# Patient Record
Sex: Female | Born: 1990 | Race: Black or African American | Hispanic: No | Marital: Single | Smoking: Never smoker
Health system: Southern US, Community
[De-identification: ages and names within clinical notes are randomized; demographics above are authoritative.]

## PROBLEM LIST (undated history)

## (undated) ENCOUNTER — Inpatient Hospital Stay (HOSPITAL_COMMUNITY): Payer: Self-pay

## (undated) DIAGNOSIS — Z789 Other specified health status: Secondary | ICD-10-CM

## (undated) DIAGNOSIS — D649 Anemia, unspecified: Secondary | ICD-10-CM

## (undated) HISTORY — PX: OTHER SURGICAL HISTORY: SHX169

## (undated) HISTORY — DX: Other specified health status: Z78.9

---
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
## 2008-07-16 IMAGING — US US GENETIC COUNSELING - NRPT [facility]
1 series · 14 of 16 positions shown · non-contrast
Comparison: none

OBSTETRICAL ULTRASOUND:
 This ultrasound was performed in The [HOSPITAL], and the [facility] report will be stored to [REDACTED] PACS.

[Series 1: us genetic counseling - nrpt [facility] · 14 of 17 slices shown]
[im 1/17]
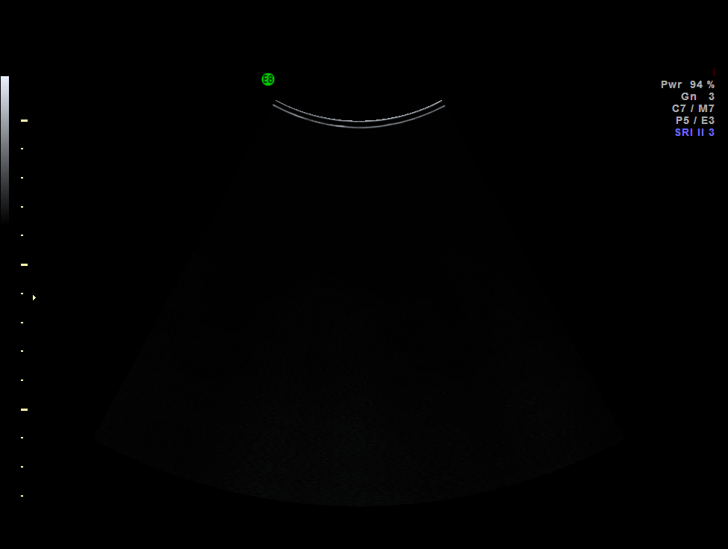
[im 2/17]
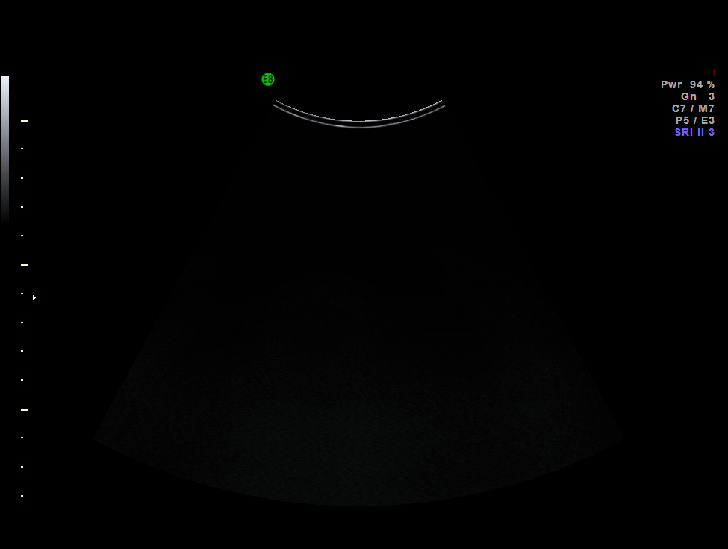
[im 3/17]
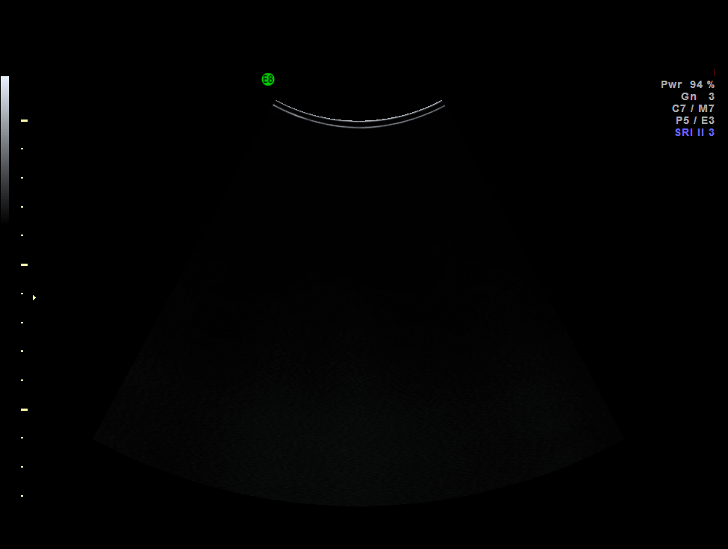
[im 5/17]
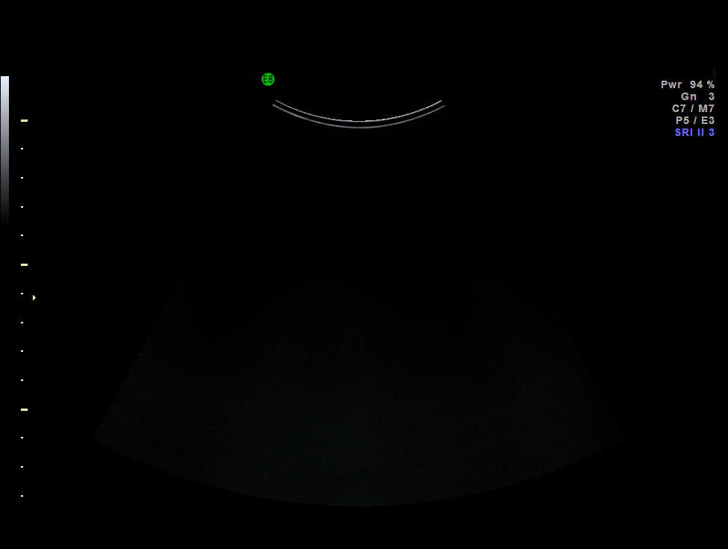
[im 6/17]
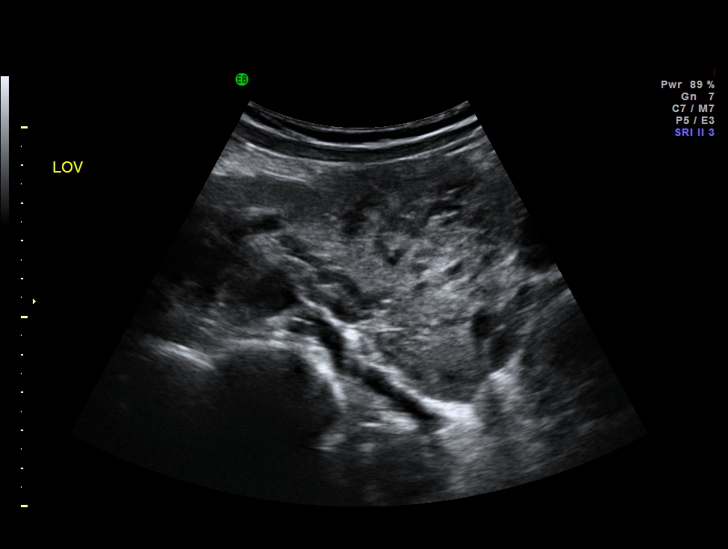
[im 7/17]
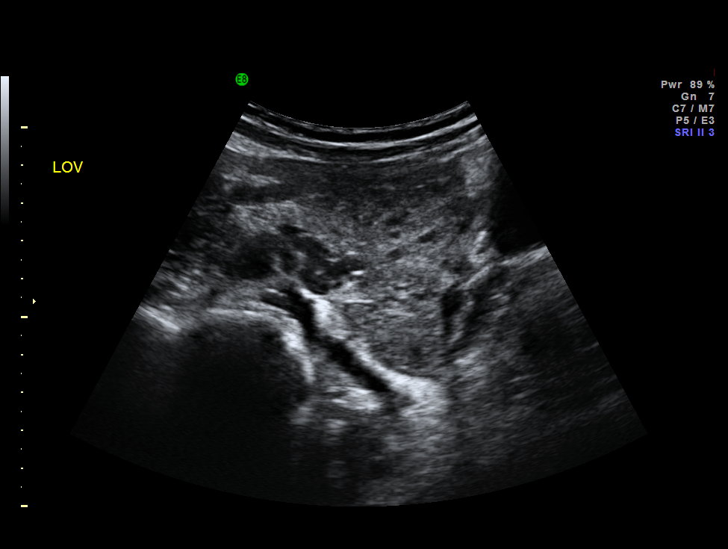
[im 8/17]
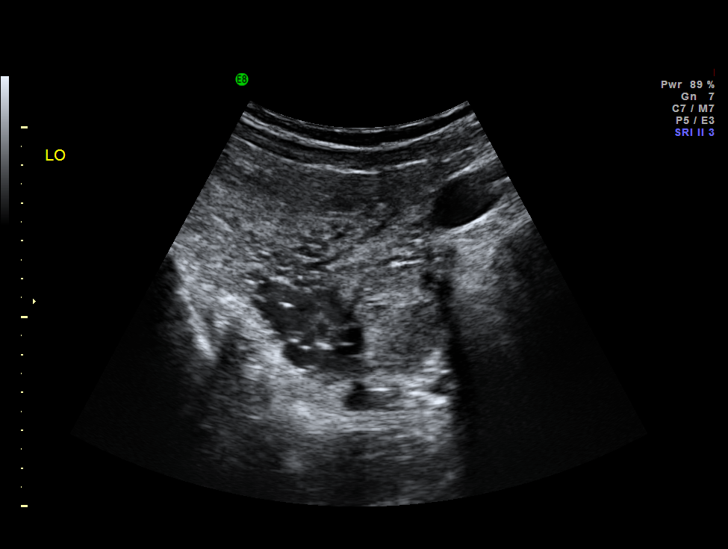
[im 9/17]
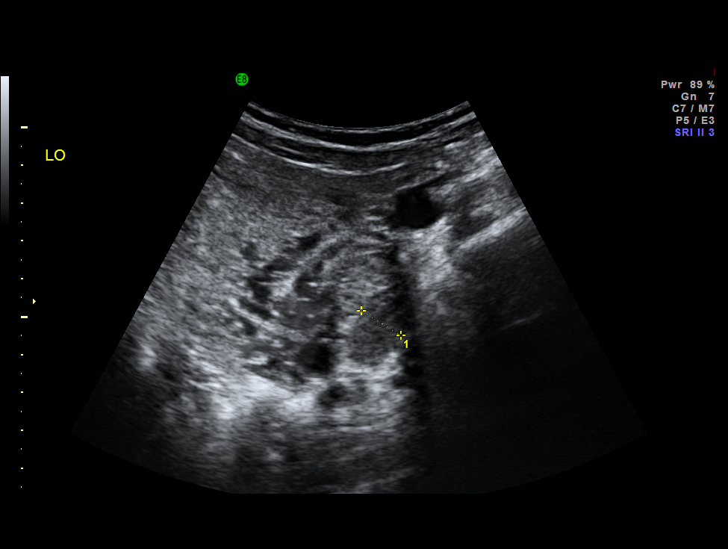
[im 10/17]
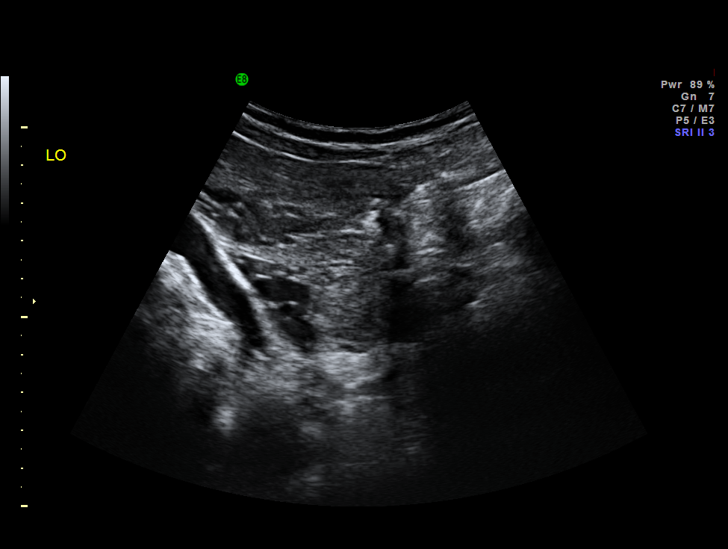
[im 11/17]
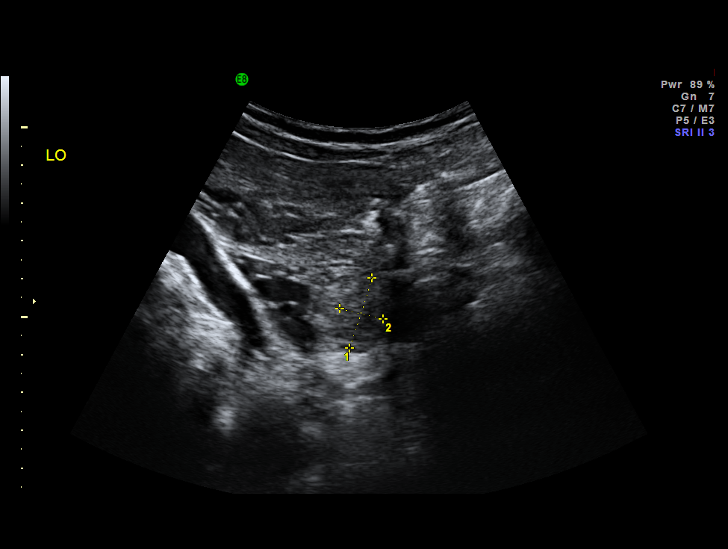
[im 13/17]
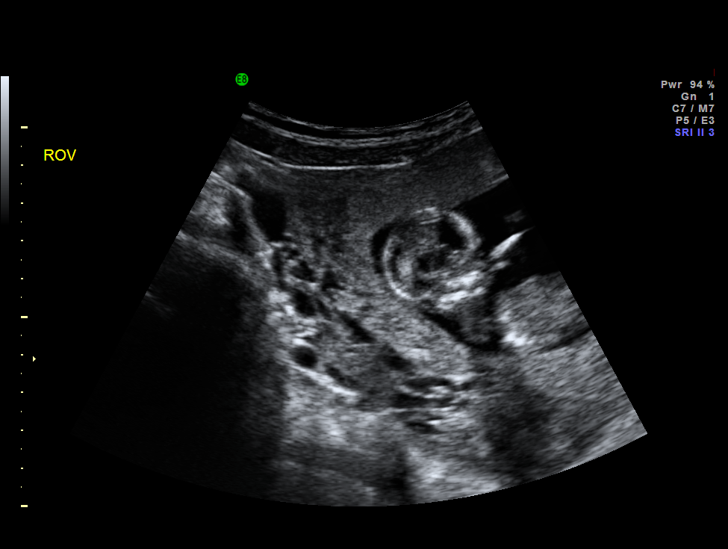
[im 14/17]
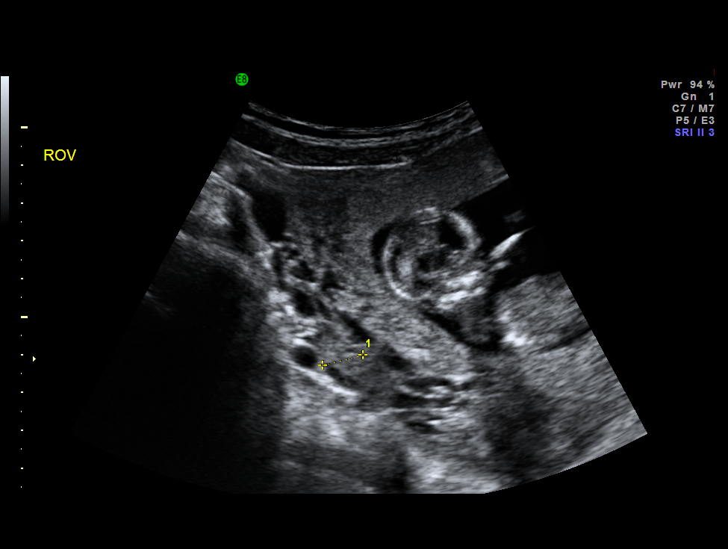
[im 15/17]
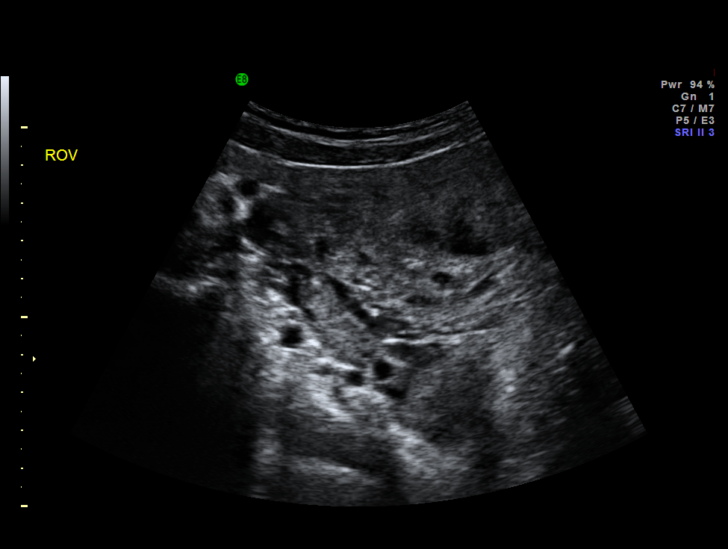
[im 17/17]
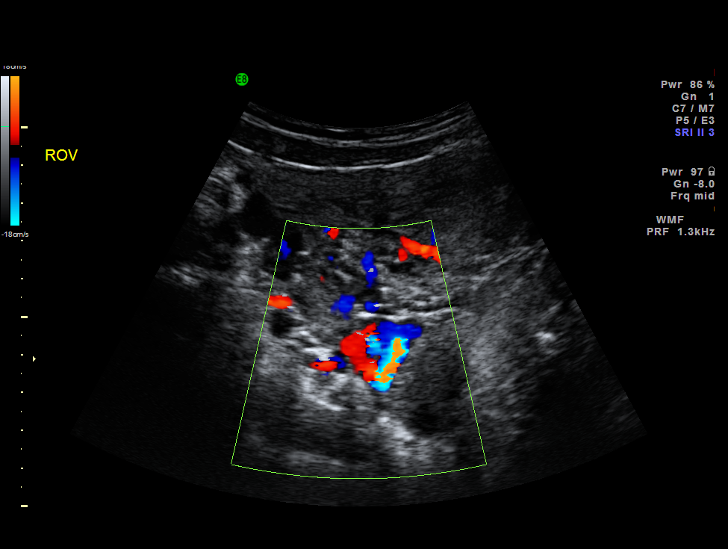

[14 of 16 positions shown; findings below may reference images not displayed]

IMPRESSION: The [facility] report has also been faxed to the ordering physician.

## 2010-12-30 ENCOUNTER — Encounter: Payer: Self-pay | Admitting: Obstetrics and Gynecology

## 2020-08-30 ENCOUNTER — Emergency Department (HOSPITAL_COMMUNITY)
Admission: EM | Admit: 2020-08-30 | Discharge: 2020-08-30 | Disposition: A | Discharge status: Home / Self Care | Payer: Medicaid Other | Attending: Emergency Medicine | Admitting: Emergency Medicine

## 2020-08-30 ENCOUNTER — Encounter (HOSPITAL_COMMUNITY): Payer: Self-pay

## 2020-08-30 ENCOUNTER — Other Ambulatory Visit: Payer: Self-pay

## 2020-08-30 DIAGNOSIS — M79602 Pain in left arm: Secondary | ICD-10-CM | POA: Diagnosis not present

## 2020-08-30 NOTE — Discharge Instructions (Addendum)
You will need to follow up with the health department or with the OB-GYN that was provided on your discharge paperwork.   Please return to the emergency department for any new or worsening symptoms.

## 2020-08-30 NOTE — ED Provider Notes (Signed)
[facility] EMERGENCY DEPARTMENT Provider Note   CSN: [id number] Arrival date & time: 08/30/20  1607     History Chief Complaint  Patient presents with  . Arm Pain    Gabrielle Gibson is a 29 y.o. female.  HPI   29 year old female presenting emergency department today for evaluation of arm pain.  Patient states that she had a Nexplanon placed about 6 weeks ago and since it was placed it has been causing her pain and she wants it taken out. Had some swelling initially but it has since improved.  There is no redness or fevers.  She has had no drainage of pus.  She tried to follow-up with health department and they said it would take 6 months before they can get it out  History reviewed. No pertinent past medical history.  There are no problems to display for this patient.   History reviewed. No pertinent surgical history.   OB History   No obstetric history on file.     No family history on file.  Social History   Tobacco Use  . Smoking status: Not on file  Substance Use Topics  . Alcohol use: Not on file  . Drug use: Not on file    Home Medications Prior to Admission medications   Not on File    Allergies    Patient has no allergy information on record.  Review of Systems   Review of Systems  Constitutional: Negative for fever.  Musculoskeletal:       Left arm pain  Skin: Negative for color change and wound.    Physical Exam Updated Vital Signs BP (!) 150/104 (BP Location: Right Arm)   Pulse (!) 107   Temp 98.1 F (36.7 C) (Oral)   Resp 18   SpO2 98%   Physical Exam Vitals and nursing note reviewed.  Constitutional:      General: She is not in acute distress.    Appearance: She is well-developed.  HENT:     Head: Normocephalic and atraumatic.  Eyes:     Conjunctiva/sclera: Conjunctivae normal.  Cardiovascular:     Rate and Rhythm: Normal rate.  Pulmonary:     Effort: Pulmonary effort is normal.  Musculoskeletal:         General: Normal range of motion.     Cervical back: Neck supple.     Comments: Mild TTP to the left upper arm over the pts nexplanon. There is no erythema, induration, fluctuance or warmth to the area.   Skin:    General: Skin is warm and dry.  Neurological:     Mental Status: She is alert.     ED Results / Procedures / Treatments   Labs (all labs ordered are listed, but only abnormal results are displayed) Labs Reviewed - No data to display  EKG None  Radiology No results found.  Procedures Procedures (including critical care time)  Medications Ordered in ED Medications - No data to display  ED Course  I have reviewed the triage vital signs and the nursing notes.  Pertinent labs & imaging results that were available during my care of the patient were reviewed by me and considered in my medical decision making (see chart for details).    MDM Rules/Calculators/A&P                          29 year old female presenting for evaluation requesting Nexplanon removal.  Had it placed several  weeks ago and has had pain ever since and is requesting to remove.  No fevers.  On exam she does not have any evidence of infection.  Discussed that this is not an emergent procedure needs to be completed by the person who placed the Nexplanon.  Patient was upset with this and asked to speak with my attending physician Dr. Rush Landmark who spoke with patient and reiterated plan.  5:23 PM CONSULT [place] with transitions of care. She states pt will need to f/u with health dept.   Discussed with [facility] provider, they do not take out nexplanon emergently  We will give patient information to follow-up with OB/GYN.  Advised to return if worse.  She voices understanding of plan reasons return precautions.  Patient able for discharge.  Final Clinical Impression(s) / ED Diagnoses Final diagnoses:  Pain in left arm    Rx / DC Orders ED Discharge Orders    None       Rayne Du 08/30/20 1756    Tegeler, Canary Brim, MD 08/30/20 1807

## 2020-08-30 NOTE — ED Triage Notes (Signed)
Pt reports severe pain in her left upper arm for the past 6 months were her nexplanon was implanted. Pt went to the health department to get it removed but they cant get her in for another 3 months.

## 2020-08-30 NOTE — ED Notes (Signed)
Went to D/C pt and pt was not in the hallway. Pt appeared in no apparent distress when I last saw her.

## 2020-09-04 ENCOUNTER — Other Ambulatory Visit: Payer: Self-pay

## 2020-09-04 ENCOUNTER — Ambulatory Visit (INDEPENDENT_AMBULATORY_CARE_PROVIDER_SITE_OTHER): Payer: Medicaid Other | Admitting: Obstetrics & Gynecology

## 2020-09-04 VITALS — BP 126/82 | HR 73 | Ht 63.0 in | Wt 171.6 lb

## 2020-09-04 DIAGNOSIS — Z3046 Encounter for surveillance of implantable subdermal contraceptive: Secondary | ICD-10-CM

## 2020-09-04 NOTE — Patient Instructions (Signed)
Etonogestrel implant What is this medicine? ETONOGESTREL (et oh noe JES trel) is a contraceptive (birth control) device. It is used to prevent pregnancy. It can be used for up to 3 years. This medicine may be used for other purposes; ask your health care provider or pharmacist if you have questions. COMMON BRAND NAME(S): Implanon, Nexplanon What should I tell my health care provider before I take this medicine? They need to know if you have any of these conditions:  abnormal vaginal bleeding  blood vessel disease or blood clots  breast, cervical, endometrial, ovarian, liver, or uterine cancer  diabetes  gallbladder disease  heart disease or recent heart attack  high blood pressure  high cholesterol or triglycerides  kidney disease  liver disease  migraine headaches  seizures  stroke  tobacco smoker  an unusual or allergic reaction to etonogestrel, anesthetics or antiseptics, other medicines, foods, dyes, or preservatives  pregnant or trying to get pregnant  breast-feeding How should I use this medicine? This device is inserted just under the skin on the inner side of your upper arm by a health care professional. Talk to your pediatrician regarding the use of this medicine in children. Special care may be needed. Overdosage: If you think you have taken too much of this medicine contact a poison control center or emergency room at once. NOTE: This medicine is only for you. Do not share this medicine with others. What if I miss a dose? This does not apply. What may interact with this medicine? Do not take this medicine with any of the following medications:  amprenavir  fosamprenavir This medicine may also interact with the following medications:  acitretin  aprepitant  armodafinil  bexarotene  bosentan  carbamazepine  certain medicines for fungal infections like fluconazole, ketoconazole, itraconazole and voriconazole  certain medicines to treat  hepatitis, HIV or AIDS  cyclosporine  felbamate  griseofulvin  lamotrigine  modafinil  oxcarbazepine  phenobarbital  phenytoin  primidone  rifabutin  rifampin  rifapentine  St. John's wort  topiramate This list may not describe all possible interactions. Give your health care provider a list of all the medicines, herbs, non-prescription drugs, or dietary supplements you use. Also tell them if you smoke, drink alcohol, or use illegal drugs. Some items may interact with your medicine. What should I watch for while using this medicine? This product does not protect you against HIV infection (AIDS) or other sexually transmitted diseases. You should be able to feel the implant by pressing your fingertips over the skin where it was inserted. Contact your doctor if you cannot feel the implant, and use a non-hormonal birth control method (such as condoms) until your doctor confirms that the implant is in place. Contact your doctor if you think that the implant may have broken or become bent while in your arm. You will receive a user card from your health care provider after the implant is inserted. The card is a record of the location of the implant in your upper arm and when it should be removed. Keep this card with your health records. What side effects may I notice from receiving this medicine? Side effects that you should report to your doctor or health care professional as soon as possible:  allergic reactions like skin rash, itching or hives, swelling of the face, lips, or tongue  breast lumps, breast tissue changes, or discharge  breathing problems  changes in emotions or moods  coughing up blood  if you feel that the implant   may have broken or bent while in your arm  high blood pressure  pain, irritation, swelling, or bruising at the insertion site  scar at site of insertion  signs of infection at the insertion site such as fever, and skin redness, pain or  discharge  signs and symptoms of a blood clot such as breathing problems; changes in vision; chest pain; severe, sudden headache; pain, swelling, warmth in the leg; trouble speaking; sudden numbness or weakness of the face, arm or leg  signs and symptoms of liver injury like dark yellow or brown urine; general ill feeling or flu-like symptoms; light-colored stools; loss of appetite; nausea; right upper belly pain; unusually weak or tired; yellowing of the eyes or skin  unusual vaginal bleeding, discharge Side effects that usually do not require medical attention (report to your doctor or health care professional if they continue or are bothersome):  acne  breast pain or tenderness  headache  irregular menstrual bleeding  nausea This list may not describe all possible side effects. Call your doctor for medical advice about side effects. You may report side effects to FDA at 1-800-FDA-1088. Where should I keep my medicine? This drug is given in a hospital or clinic and will not be stored at home. NOTE: This sheet is a summary. It may not cover all possible information. If you have questions about this medicine, talk to your doctor, pharmacist, or health care provider.  2020 Elsevier/Gold Standard (2019-09-07 11:33:04)  

## 2020-09-04 NOTE — Progress Notes (Signed)
GYNECOLOGY OFFICE PROCEDURE NOTE  Gabrielle Gibson is a 29 y.o. No obstetric history on file. here for Nexplanon removal.  Last pap smear was on at [facility] this year and was normal.  No other gynecologic concerns.    Nexplanon Removal Patient identified, informed consent performed, consent signed.   Appropriate time out taken. Nexplanon site identified.  Area prepped in usual sterile fashon. One ml of 1% lidocaine was used to anesthetize the area at the distal end of the implant. A small stab incision was made right beside the implant on the distal portion.  The Nexplanon rod was grasped using hemostats and removed without difficulty.  There was minimal blood loss. There were no complications.  Steri-strips were applied over the small incision.  A pressure bandage was applied to reduce any bruising.  The patient tolerated the procedure well and was given post procedure instructions.  Patient is planning to attempt conception.      Adam Phenix, MD Attending Obstetrician & Gynecologist, [facility]  09/04/2020

## 2020-09-28 ENCOUNTER — Encounter: Payer: Self-pay | Admitting: *Deleted

## 2022-07-08 ENCOUNTER — Ambulatory Visit (HOSPITAL_COMMUNITY)
Admission: EM | Admit: 2022-07-08 | Discharge: 2022-07-08 | Disposition: A | Discharge status: Home / Self Care | Payer: Medicaid Other | Attending: Family Medicine | Admitting: Family Medicine

## 2022-07-08 ENCOUNTER — Encounter (HOSPITAL_COMMUNITY): Payer: Self-pay | Admitting: Emergency Medicine

## 2022-07-08 DIAGNOSIS — S00452A Superficial foreign body of left ear, initial encounter: Secondary | ICD-10-CM

## 2022-07-08 DIAGNOSIS — T162XXA Foreign body in left ear, initial encounter: Secondary | ICD-10-CM

## 2022-07-08 MED ORDER — LIDOCAINE HCL (PF) 1 % IJ SOLN
INTRAMUSCULAR | Status: AC
  Filled 2022-07-08: qty 30

## 2022-07-08 NOTE — ED Triage Notes (Signed)
Pt reports getting an ear ring back stuck in her left ear. States it has been stuck for 1.5 days.

## 2022-07-08 NOTE — Discharge Instructions (Signed)
Follow instructions from your health care provider about how to take care of your wound. Make sure you: Wash your hands with soap and water for at least 20 seconds before and after you change your bandage (dressing). If soap and water are not available, use hand sanitizer. Change your dressing as told by your health care provider. Check your wound every day for signs of infection. Check for: Redness, swelling, or pain. Fluid or blood. Pus or a bad smell. New warmth, a rash, or hardness at the wound site.

## 2022-07-10 NOTE — ED Provider Notes (Signed)
[facility]   [id number] 07/08/22 Arrival Time: 1228  ASSESSMENT & PLAN:  1. Acute foreign body of left earlobe, initial encounter     Foreign Body Removal Procedure Note  Anesthesia: 1% plain lidocaine  Procedure Details  The procedure, risks and complications have been discussed in detail (including, but not limited to pain and bleeding) with the patient.  The skin induration was prepped and draped in the usual fashion. After lower ear block performed with lidocaine, an approx 3 mm incision made over lower posterior LEFT earlobe to expose metal earring backing; using Kelly hemostat, FB grasped and removed without complication. Minimal bleeding. No suturing required. No signs of infection.  Packing: n/a Condition: Tolerated procedure well Complications: none.     Discharge Instructions      Follow instructions from your health care provider about how to take care of your wound. Make sure you: Wash your hands with soap and water for at least 20 seconds before and after you change your bandage (dressing). If soap and water are not available, use hand sanitizer. Change your dressing as told by your health care provider. Check your wound every day for signs of infection. Check for: Redness, swelling, or pain. Fluid or blood. Pus or a bad smell. New warmth, a rash, or hardness at the wound site.    OTC analgesics as needed.  Reviewed expectations re: course of current medical issues. Questions answered. Outlined signs and symptoms indicating need for more acute intervention. Patient verbalized understanding. After Visit Summary given.   SUBJECTIVE:  Gabrielle Gibson is a 31 y.o. female who reports back of earring stuck in earlobe; unsure how long. Unable to remove at home secondary to pain. No bleeding/drainage. OTC/home treatment: none.   OBJECTIVE:  Vitals:   07/08/22 1248  BP: 134/76  Pulse: 90  Resp: 17  Temp: 98.4 F (36.9 C)  TempSrc: Oral   SpO2: 97%     General appearance: alert; no distress HEENT: small induration of posterior LEFT earlobe; no active drainage or bleeding Psychological: alert and cooperative; normal mood and affect  No Known Allergies  History reviewed. No pertinent past medical history. Social History   Socioeconomic History   Marital status: Single    Spouse name: Not on file   Number of children: Not on file   Years of education: Not on file   Highest education level: Not on file  Occupational History   Not on file  Tobacco Use   Smoking status: Never   Smokeless tobacco: Never  Substance and Sexual Activity   Alcohol use: Not on file   Drug use: Not on file   Sexual activity: Not on file  Other Topics Concern   Not on file  Social History Narrative   Not on file   Social Determinants of Health   Financial Resource Strain: Not on file  Food Insecurity: No Food Insecurity (09/04/2020)   Hunger Vital Sign    Worried About Running Out of Food in the Last Year: Never true    Ran Out of Food in the Last Year: Never true  Transportation Needs: No Transportation Needs (09/04/2020)   PRAPARE - Administrator, Civil Service (Medical): No    Lack of Transportation (Non-Medical): No  Physical Activity: Not on file  Stress: Not on file  Social Connections: Not on file   History reviewed. No pertinent family history. History reviewed. No pertinent surgical history.  Mardella Layman, MD 07/10/22 [phone]

## 2022-10-04 ENCOUNTER — Ambulatory Visit (HOSPITAL_COMMUNITY)
Admission: EM | Admit: 2022-10-04 | Discharge: 2022-10-04 | Disposition: A | Discharge status: Home / Self Care | Payer: Medicaid Other | Attending: Internal Medicine | Admitting: Internal Medicine

## 2022-10-04 ENCOUNTER — Encounter (HOSPITAL_COMMUNITY): Payer: Self-pay

## 2022-10-04 DIAGNOSIS — H6122 Impacted cerumen, left ear: Secondary | ICD-10-CM | POA: Diagnosis not present

## 2022-10-04 DIAGNOSIS — L910 Hypertrophic scar: Secondary | ICD-10-CM

## 2022-10-04 MED ORDER — IBUPROFEN 800 MG PO TABS
ORAL_TABLET | ORAL | Status: AC
  Filled 2022-10-04: qty 1

## 2022-10-04 MED ORDER — IBUPROFEN 800 MG PO TABS
800.0000 mg | ORAL_TABLET | Freq: Once | ORAL | Status: AC
  Administered 2022-10-04: 800 mg via ORAL

## 2022-10-04 MED ORDER — AMOXICILLIN-POT CLAVULANATE 875-125 MG PO TABS: 1.0000 | ORAL_TABLET | Freq: Two times a day (BID) | ORAL | 0 refills | Status: DC

## 2022-10-04 NOTE — Discharge Instructions (Addendum)
We cleaned out your ear today.  We gave you ibuprofen to help with pain and inflammation to the ear.  You may take 800 mg of ibuprofen every 8 hours as needed at home for pain and inflammation. Take Augmentin antibiotic 2 times daily for the next 7 days to help with infection to the keloid.  Call the plastic surgeon listed on your paperwork to schedule an appointment for consultation for keloid removal.  Return to urgent care as needed.

## 2022-10-04 NOTE — ED Triage Notes (Signed)
Patient noticed the keloid on her left ear growing the end of last week, first noticed it small a year ago.  Patient states she has had her top er piercing since she was 82 with no problems. Thinks her new ear ring is too small.  States the left ear is now painful and she has removed the ear ring.

## 2022-10-04 NOTE — ED Provider Notes (Signed)
[place]    CSN: [id number] Arrival date & time: 10/04/22  1250      History   Chief Complaint Chief Complaint  Patient presents with   Keloid    HPI Gabrielle Gibson is a 31 y.o. female.   Patient presents to urgent care for evaluation of keloid to the left ear that she first noticed one year ago and has grown significantly in size over the last couple of days. Denies new piercing to the left ear but reports changing the jewelry to the left ear cartilage piercing recently to a smaller gauge bar. She has since removed the jewelry due to symptoms. Left ear is generally painful to touch due to keloid. Patient admits that she attempted to drain the keloid herself without success a couple of days ago, this caused pain to become significantly worse. Pain has made it difficult for patient to work and keloid is very tender to touch. Reports fever at home of "106.4 orally" and states this has come down significantly since taking tylenol prior to arrival at urgent care. She is now afebrile. Denies viral URI symptoms, neck pain, dizziness, ear ringing, and inner left ear pain. Denies recent antibiotic use. Has not attempted use of any over the counter medications prior to arrival at urgent care for pain related to keloid.     History reviewed. No pertinent past medical history.  There are no problems to display for this patient.   History reviewed. No pertinent surgical history.  OB History   No obstetric history on file.      Home Medications    Prior to Admission medications   Medication Sig Start Date End Date Taking? Authorizing Provider  amoxicillin-clavulanate (AUGMENTIN) 875-125 MG tablet Take 1 tablet by mouth every 12 (twelve) hours. 10/04/22  Yes Talbot Grumbling, FNP    Family History History reviewed. No pertinent family history.  Social History Social History   Tobacco Use   Smoking status: Never   Smokeless tobacco: Never  Vaping Use    Vaping Use: Never used  Substance Use Topics   Alcohol use: Not Currently   Drug use: Never     Allergies   Patient has no known allergies.   Review of Systems Review of Systems Per HPI  Physical Exam Triage Vital Signs ED Triage Vitals  Enc Vitals Group     BP 10/04/22 1409 125/83     Pulse Rate 10/04/22 1409 75     Resp 10/04/22 1409 16     Temp 10/04/22 1409 98.6 F (37 C)     Temp Source 10/04/22 1409 Oral     SpO2 10/04/22 1409 100 %     Weight --      Height --      Head Circumference --      Peak Flow --      Pain Score 10/04/22 1407 8     Pain Loc --      Pain Edu? --      Excl. in [place]? --    No data found.  Updated Vital Signs BP 125/83 (BP Location: Right Arm)   Pulse 75   Temp 98.6 F (37 C) (Oral)   Resp 16   LMP 10/02/2022 (Approximate)   SpO2 100%   Visual Acuity Right Eye Distance:   Left Eye Distance:   Bilateral Distance:    Right Eye Near:   Left Eye Near:    Bilateral Near:  Physical Exam Vitals and nursing note reviewed.  Constitutional:      Appearance: She is not ill-appearing or toxic-appearing.  HENT:     Head: Normocephalic and atraumatic.     Right Ear: Hearing, tympanic membrane, ear canal and external ear normal.     Left Ear: Hearing, tympanic membrane, ear canal and external ear normal. There is impacted cerumen.     Ears:     Comments: Impacted cerumen to the left ear canal upon initial assessment of left ear, clear upon reassessment without signs of infection. Keloid to the left helix that is extremely tender to touch. Keloid is not fluctuant and is not warm, but appears red. No surrounding pre/postauricular lymphadenopathy, swelling, warmth, or erythema. See images below for further detail.     Nose: Nose normal.     Mouth/Throat:     Lips: Pink.     Mouth: Mucous membranes are moist.     Pharynx: No posterior oropharyngeal erythema.  Eyes:     General: Lids are normal. Vision grossly intact. Gaze aligned  appropriately.     Extraocular Movements: Extraocular movements intact.     Conjunctiva/sclera: Conjunctivae normal.  Pulmonary:     Effort: Pulmonary effort is normal.  Musculoskeletal:     Cervical back: Neck supple.  Skin:    General: Skin is warm and dry.     Capillary Refill: Capillary refill takes less than 2 seconds.     Findings: No rash.  Neurological:     General: No focal deficit present.     Mental Status: She is alert and oriented to person, place, and time. Mental status is at baseline.     Cranial Nerves: No dysarthria or facial asymmetry.  Psychiatric:        Mood and Affect: Mood normal.        Speech: Speech normal.        Behavior: Behavior normal.        Thought Content: Thought content normal.        Judgment: Judgment normal.         UC Treatments / Results  Labs (all labs ordered are listed, but only abnormal results are displayed) Labs Reviewed - No data to display  EKG   Radiology No results found.  Procedures Procedures (including critical care time)  Medications Ordered in UC Medications  ibuprofen (ADVIL) tablet 800 mg (800 mg Oral Given 10/04/22 1523)    Initial Impression / Assessment and Plan / UC Course  I have reviewed the triage vital signs and the nursing notes.  Pertinent labs & imaging results that were available during my care of the patient were reviewed by me and considered in my medical decision making (see chart for details).   1. Keloid of left ear Augmentin antibiotic prescribed to treat infected keloid. Information for plastic surgery given and recommend patient call to schedule an appointment for consultation regarding removal of keloid. Ibuprofen 800mg  every 8 hours may be used for pain and inflammation, dose given here. Advised to take this with food.   2. Impacted cerumen of left ear Cerumen impaction resolved after ear lavage by nursing staff in clinic. Debrox ear drops may be used over the counter in the future  to treat impacted cerumen if needed. Discussed avoiding use of Q-tips to the ears, patient expresses understanding.   Discussed physical exam and available lab work findings in clinic with patient.  Counseled patient regarding appropriate use of medications and potential side effects for  all medications recommended or prescribed today. Discussed red flag signs and symptoms of worsening condition,when to call the PCP office, return to urgent care, and when to seek higher level of care in the emergency department. Patient verbalizes understanding and agreement with plan. All questions answered. Patient discharged in stable condition.    Final Clinical Impressions(s) / UC Diagnoses   Final diagnoses:  Keloid of skin  Impacted cerumen of left ear     Discharge Instructions      We cleaned out your ear today.  We gave you ibuprofen to help with pain and inflammation to the ear.  You may take 800 mg of ibuprofen every 8 hours as needed at home for pain and inflammation. Take Augmentin antibiotic 2 times daily for the next 7 days to help with infection to the keloid.  Call the plastic surgeon listed on your paperwork to schedule an appointment for consultation for keloid removal.  Return to urgent care as needed.    ED Prescriptions     Medication Sig Dispense Auth. Provider   amoxicillin-clavulanate (AUGMENTIN) 875-125 MG tablet Take 1 tablet by mouth every 12 (twelve) hours. 14 tablet Talbot Grumbling, FNP      PDMP not reviewed this encounter.   Talbot Grumbling, [place] 10/04/22 2159

## 2023-04-08 ENCOUNTER — Ambulatory Visit (HOSPITAL_COMMUNITY)
Admission: EM | Admit: 2023-04-08 | Discharge: 2023-04-08 | Disposition: A | Discharge status: Home / Self Care | Payer: Medicaid Other | Attending: Family Medicine | Admitting: Family Medicine

## 2023-04-08 ENCOUNTER — Encounter (HOSPITAL_COMMUNITY): Payer: Self-pay

## 2023-04-08 DIAGNOSIS — Z202 Contact with and (suspected) exposure to infections with a predominantly sexual mode of transmission: Secondary | ICD-10-CM | POA: Diagnosis present

## 2023-04-08 LAB — POCT URINE PREGNANCY: Preg Test, Ur: NEGATIVE

## 2023-04-08 MED ORDER — DOXYCYCLINE HYCLATE 100 MG PO CAPS: 100.0000 mg | ORAL_CAPSULE | Freq: Two times a day (BID) | ORAL | 0 refills | Status: AC

## 2023-04-08 NOTE — ED Provider Notes (Signed)
[facility]    CSN: [id number] Arrival date & time: 04/08/23  1055      History   Chief Complaint Chief Complaint  Patient presents with   SEXUALLY TRANSMITTED DISEASE    HPI Gabrielle Gibson is a 32 y.o. female.   Patient is here for exposure to STD.  Not sure if is gonorrhea or chlamydia.  He was treated here for this. (Upon review of the chart he tested positive for chlamydia).  No vaginal symptoms.  No discharge or itching.  No urinary symptoms.  LMP was 03/14/23.  She declines any blood work today.       History reviewed. No pertinent past medical history.  There are no problems to display for this patient.   History reviewed. No pertinent surgical history.  OB History   No obstetric history on file.      Home Medications    Prior to Admission medications   Not on File    Family History History reviewed. No pertinent family history.  Social History Social History   Tobacco Use   Smoking status: Never   Smokeless tobacco: Never  Vaping Use   Vaping Use: Never used  Substance Use Topics   Alcohol use: Not Currently   Drug use: Never     Allergies   Patient has no known allergies.   Review of Systems Review of Systems  Constitutional: Negative.   HENT: Negative.    Respiratory: Negative.    Gastrointestinal: Negative.   Musculoskeletal: Negative.      Physical Exam Triage Vital Signs ED Triage Vitals [04/08/23 1219]  Enc Vitals Group     BP (!) 170/86     Pulse Rate (!) 122     Resp 18     Temp 97.6 F (36.4 C)     Temp Source Oral     SpO2      Weight      Height      Head Circumference      Peak Flow      Pain Score      Pain Loc      Pain Edu?      Excl. in GC?    No data found.  Updated Vital Signs BP (!) 170/86 (BP Location: Left Arm)   Pulse (!) 122   Temp 97.6 F (36.4 C) (Oral)   Resp 18   Visual Acuity Right Eye Distance:   Left Eye Distance:   Bilateral Distance:    Right Eye Near:    Left Eye Near:    Bilateral Near:     Physical Exam Constitutional:      Appearance: Normal appearance.  Cardiovascular:     Rate and Rhythm: Regular rhythm.  Pulmonary:     Effort: Pulmonary effort is normal.     Breath sounds: Normal breath sounds.  Neurological:     General: No focal deficit present.     Mental Status: She is alert.  Psychiatric:        Mood and Affect: Mood normal.      UC Treatments / Results  Labs (all labs ordered are listed, but only abnormal results are displayed) Labs Reviewed  POCT URINE PREGNANCY  CERVICOVAGINAL ANCILLARY ONLY   UPT negative EKG   Radiology No results found.  Procedures Procedures (including critical care time)  Medications Ordered in UC Medications - No data to display  Initial Impression / Assessment and Plan / UC Course  I have reviewed the  triage vital signs and the nursing notes.  Pertinent labs & imaging results that were available during my care of the patient were reviewed by me and considered in my medical decision making (see chart for details).   Final Clinical Impressions(s) / UC Diagnoses   Final diagnoses:  STD exposure     Discharge Instructions      You were seen today for STD exposure.  I have treated you for chlamydia with a script of doxycycline to your pharmacy.  Please take all of the medication as directed.  Please avoid all intercourse until you and your partner have completed all antibiotics.  Your swab will be resulted tomorrow and if anything else is positive we will call to notify you for treatment.     ED Prescriptions     Medication Sig Dispense Auth. Provider   doxycycline (VIBRAMYCIN) 100 MG capsule Take 1 capsule (100 mg total) by mouth 2 (two) times daily for 7 days. 14 capsule Jannifer Franklin, MD      PDMP not reviewed this encounter.   Jannifer Franklin, MD 04/08/23 1245

## 2023-04-08 NOTE — Discharge Instructions (Signed)
You were seen today for STD exposure.  I have treated you for chlamydia with a script of doxycycline to your pharmacy.  Please take all of the medication as directed.  Please avoid all intercourse until you and your partner have completed all antibiotics.  Your swab will be resulted tomorrow and if anything else is positive we will call to notify you for treatment.

## 2023-04-08 NOTE — ED Triage Notes (Addendum)
Here for STI-testing,Husband tested positive for Gonorrhea. Denies any vaginal discharge.

## 2023-04-09 LAB — CERVICOVAGINAL ANCILLARY ONLY
Bacterial Vaginitis (gardnerella): POSITIVE — AB
Candida Glabrata: NEGATIVE
Candida Vaginitis: NEGATIVE
Chlamydia: POSITIVE — AB
Comment: NEGATIVE
Comment: NEGATIVE
Comment: NEGATIVE
Comment: NEGATIVE
Comment: NEGATIVE
Comment: NORMAL
Neisseria Gonorrhea: NEGATIVE
Trichomonas: NEGATIVE

## 2023-04-10 ENCOUNTER — Telehealth (HOSPITAL_COMMUNITY): Payer: Self-pay | Admitting: Emergency Medicine

## 2023-04-10 MED ORDER — METRONIDAZOLE 0.75 % VA GEL: 1.0000 | Freq: Every day | VAGINAL | 0 refills | Status: AC

## 2023-12-10 NOTE — L&D Delivery Note (Signed)
 Delivery Note 2318 completely dilated  At 11:22 PM a viable and healthy female was delivered via Vaginal, Spontaneous (Presentation:   Occiput Anterior).  APGAR: 9, 9; weight  .   Placenta status: Spontaneous, Intact.  Cord: 3 vessels with the following complications: None.   Anesthesia: Epidural Episiotomy: None Lacerations: None Suture Repair: none required Est. Blood Loss (mL): 450  Mom to postpartum.  Baby to Couplet care / Skin to Skin.  Earnie Pouch 07/07/2024, 12:25 AM

## 2024-02-09 ENCOUNTER — Telehealth: Payer: Self-pay | Admitting: *Deleted

## 2024-02-09 DIAGNOSIS — Z3492 Encounter for supervision of normal pregnancy, unspecified, second trimester: Secondary | ICD-10-CM

## 2024-02-09 DIAGNOSIS — O283 Abnormal ultrasonic finding on antenatal screening of mother: Secondary | ICD-10-CM

## 2024-02-09 LAB — PREGNANCY, URINE: Preg Test, Ur: POSITIVE

## 2024-02-09 NOTE — Telephone Encounter (Signed)
 Message received from Hedwig Morton @ [facility]. She stated that pt was seen in their facility. By LMP pt should have been 10.3wks however fetal pole measured 17.6 wks. Also there was a fluid filled sac next to the uterus. Pt is being referred for prenatal care as well as follow up US. I called pt and confirmed that she wants to receive care in this office. I advised her that she will be contacted with ultrasound and prenatal care appointments. She voiced understanding.

## 2024-02-23 ENCOUNTER — Encounter: Payer: Self-pay | Admitting: *Deleted

## 2024-02-26 ENCOUNTER — Other Ambulatory Visit: Payer: Self-pay

## 2024-02-26 ENCOUNTER — Encounter: Payer: Self-pay | Admitting: Obstetrics and Gynecology

## 2024-02-26 ENCOUNTER — Ambulatory Visit (INDEPENDENT_AMBULATORY_CARE_PROVIDER_SITE_OTHER): Payer: MEDICAID | Admitting: *Deleted

## 2024-02-26 ENCOUNTER — Encounter: Payer: Self-pay | Admitting: *Deleted

## 2024-02-26 VITALS — BP 129/87 | HR 102 | Ht 62.0 in | Wt 158.4 lb

## 2024-02-26 DIAGNOSIS — Z3492 Encounter for supervision of normal pregnancy, unspecified, second trimester: Secondary | ICD-10-CM | POA: Diagnosis not present

## 2024-02-26 DIAGNOSIS — Z1332 Encounter for screening for maternal depression: Secondary | ICD-10-CM | POA: Diagnosis not present

## 2024-02-26 DIAGNOSIS — Z3490 Encounter for supervision of normal pregnancy, unspecified, unspecified trimester: Secondary | ICD-10-CM | POA: Insufficient documentation

## 2024-02-26 DIAGNOSIS — Z3A19 19 weeks gestation of pregnancy: Secondary | ICD-10-CM

## 2024-02-26 DIAGNOSIS — O283 Abnormal ultrasonic finding on antenatal screening of mother: Secondary | ICD-10-CM | POA: Insufficient documentation

## 2024-02-26 MED ORDER — BLOOD PRESSURE KIT DEVI: 1.0000 | 0 refills | Status: DC | PRN

## 2024-02-26 MED ORDER — PRENATAL 27-1 MG PO TABS: 1.0000 | ORAL_TABLET | Freq: Every day | ORAL | 11 refills | Status: AC

## 2024-02-26 MED ORDER — GOJJI WEIGHT SCALE MISC: 1.0000 | 0 refills | Status: DC

## 2024-02-26 NOTE — Progress Notes (Signed)
 New OB Intake  Patient came to office for in person intake.  I explained I am completing New OB Intake today. We discussed EDD not yet determined. Explained since [facility] due to US showing size greater than dates and found a fluid filled sac next to uterus we need to do another [place] to determine EDD and follow up on sac. [place] to be scheduled and patient will see  notification in MyChart.  Pt is [id number].. I reviewed her allergies, medications and Medical/Surgical/OB history.    Patient Active Problem List   Diagnosis Date Noted   Supervision of low-risk pregnancy 02/26/2024   Abnormal pregnancy [place] 02/26/2024    Concerns addressed today  Delivery Plans Plans to deliver at [facility]. Discussed the nature of our practice with multiple providers including residents and students. Due to the size of the practice, the delivering provider may not be the same as those providing prenatal care.   Patient is not interested in water birth.   MyChart/Babyscripts MyChart access verified. I explained pt will have some visits in office and some virtually. Babyscripts instructions given and order placed.  Blood Pressure Cuff/Weight Scale Blood pressure cuff ordered for patient to pick-up from [employer]. Explained after first prenatal appt pt will check weekly and document in Babyscripts. Patient does not have weight scale; order sent to [facility], patient may track weight weekly in Babyscripts.  Anatomy US Explained first scheduled [place] will be asap and she will be notified by Mychart.   Is patient a CenteringPregnancy candidate?  Declined Declined due to  Group setting/ time.   Is patient a Mom+Baby Combined Care candidate?  Not a candidate    Interested in [place]?  Declines    Is patient a candidate for Babyscripts Optimization? No, due to abnormal [place].    First visit review I reviewed new OB appt with patient. Explained pt will be seen by Dr. Donavan Foil at first  visit. Discussed Avelina Laine genetic screening with patient. She does not want  Panorama and Horizon.. Routine prenatal labs drawn today.    Last Pap No results found for: "DIAGPAP"  Nancy Fetter 02/26/2024  2:12 PM

## 2024-02-28 LAB — CBC/D/PLT+RPR+RH+ABO+RUBIGG...
Antibody Screen: NEGATIVE
Basophils Absolute: 0 10*3/uL (ref 0.0–0.2)
Basos: 1 %
EOS (ABSOLUTE): 0.1 10*3/uL (ref 0.0–0.4)
Eos: 1 %
HCV Ab: NONREACTIVE
HIV Screen 4th Generation wRfx: NONREACTIVE
Hematocrit: 37.8 % (ref 34.0–46.6)
Hemoglobin: 11.9 g/dL (ref 11.1–15.9)
Hepatitis B Surface Ag: NEGATIVE
Immature Grans (Abs): 0 10*3/uL (ref 0.0–0.1)
Immature Granulocytes: 1 %
Lymphocytes Absolute: 1.4 10*3/uL (ref 0.7–3.1)
Lymphs: 22 %
MCH: 27.4 pg (ref 26.6–33.0)
MCHC: 31.5 g/dL (ref 31.5–35.7)
MCV: 87 fL (ref 79–97)
Monocytes Absolute: 0.5 10*3/uL (ref 0.1–0.9)
Monocytes: 8 %
Neutrophils Absolute: 4.4 10*3/uL (ref 1.4–7.0)
Neutrophils: 67 %
Platelets: 284 10*3/uL (ref 150–450)
RBC: 4.34 x10E6/uL (ref 3.77–5.28)
RDW: 14.1 % (ref 11.7–15.4)
RPR Ser Ql: NONREACTIVE
Rh Factor: POSITIVE
Rubella Antibodies, IGG: 0.94 {index} — ABNORMAL LOW (ref >0.99)
WBC: 6.4 10*3/uL (ref 3.4–10.8)

## 2024-02-28 LAB — HCV INTERPRETATION

## 2024-03-01 LAB — CULTURE, OB URINE

## 2024-03-01 LAB — URINE CULTURE, OB REFLEX

## 2024-03-02 ENCOUNTER — Telehealth: Payer: Self-pay

## 2024-03-02 ENCOUNTER — Other Ambulatory Visit: Payer: Self-pay | Admitting: Obstetrics and Gynecology

## 2024-03-02 ENCOUNTER — Encounter: Payer: Self-pay | Admitting: *Deleted

## 2024-03-02 DIAGNOSIS — O2342 Unspecified infection of urinary tract in pregnancy, second trimester: Secondary | ICD-10-CM

## 2024-03-02 MED ORDER — CEPHALEXIN 500 MG PO CAPS: 500.0000 mg | ORAL_CAPSULE | Freq: Three times a day (TID) | ORAL | 0 refills | Status: DC

## 2024-03-02 NOTE — Telephone Encounter (Addendum)
-----   Message from Warden Fillers sent at 03/02/2024 12:05 PM EDT ----- E coli UTI noted, rx sent for keflex  Pt notified.    Leonette Nutting

## 2024-03-03 ENCOUNTER — Ambulatory Visit: Payer: MEDICAID

## 2024-03-03 ENCOUNTER — Other Ambulatory Visit: Payer: Self-pay | Admitting: *Deleted

## 2024-03-03 ENCOUNTER — Ambulatory Visit (HOSPITAL_BASED_OUTPATIENT_CLINIC_OR_DEPARTMENT_OTHER): Payer: MEDICAID | Admitting: Obstetrics

## 2024-03-03 ENCOUNTER — Encounter: Payer: Self-pay | Admitting: *Deleted

## 2024-03-03 ENCOUNTER — Ambulatory Visit: Payer: MEDICAID | Attending: Obstetrics and Gynecology | Admitting: *Deleted

## 2024-03-03 ENCOUNTER — Ambulatory Visit (HOSPITAL_BASED_OUTPATIENT_CLINIC_OR_DEPARTMENT_OTHER): Payer: MEDICAID

## 2024-03-03 VITALS — BP 115/58 | HR 89

## 2024-03-03 DIAGNOSIS — O0932 Supervision of pregnancy with insufficient antenatal care, second trimester: Secondary | ICD-10-CM

## 2024-03-03 DIAGNOSIS — O09522 Supervision of elderly multigravida, second trimester: Secondary | ICD-10-CM | POA: Insufficient documentation

## 2024-03-03 DIAGNOSIS — O283 Abnormal ultrasonic finding on antenatal screening of mother: Secondary | ICD-10-CM | POA: Insufficient documentation

## 2024-03-03 DIAGNOSIS — Z3A20 20 weeks gestation of pregnancy: Secondary | ICD-10-CM

## 2024-03-03 DIAGNOSIS — Z363 Encounter for antenatal screening for malformations: Secondary | ICD-10-CM | POA: Insufficient documentation

## 2024-03-03 DIAGNOSIS — O26842 Uterine size-date discrepancy, second trimester: Secondary | ICD-10-CM | POA: Diagnosis not present

## 2024-03-03 DIAGNOSIS — O358XX0 Maternal care for other (suspected) fetal abnormality and damage, not applicable or unspecified: Secondary | ICD-10-CM | POA: Diagnosis not present

## 2024-03-03 DIAGNOSIS — Z3490 Encounter for supervision of normal pregnancy, unspecified, unspecified trimester: Secondary | ICD-10-CM

## 2024-03-03 NOTE — Progress Notes (Signed)
 MFM Consult Note  Gabrielle Gibson was seen due to advanced maternal age (32 years old at time of delivery).  She has not had a prenatal visit yet in her current pregnancy.   She had an [facility] performed at [facility] that showed size that were greater than dates and a fluid-filled sac next to the uterus.  She denies any significant past medical history and denies any problems in her current pregnancy.    She reports 3 prior uncomplicated full-term deliveries.  As she has not had a prenatal visit yet, she has not had a screening test for fetal aneuploidy drawn in her current pregnancy.  Based on the fetal biometry measurements obtained today, her EDC was changed to July 17, 2024, making her 20 weeks and 4 days pregnant today.  There were no obvious fetal anomalies noted on today's ultrasound exam.  The previously noted fluid-filled sac (at the [facility]) next to the uterus was not visualized on today's exam.  The patient was informed that anomalies may be missed due to technical limitations. If the fetus is in a suboptimal position or maternal habitus is increased, visualization of the fetus in the maternal uterus may be impaired.  The increased risk of fetal aneuploidy due to advanced maternal age was discussed.   Due to advanced maternal age, the patient was offered and declined an amniocentesis today for definitive diagnosis of fetal aneuploidy.  The patient had the Panorama cell free DNA test drawn following today's ultrasound exam to screen for fetal aneuploidy.  Our genetic counselor will notify the patient regarding the results of this test.    A follow-up exam was scheduled in 6 weeks to assess the fetal growth and to confirm her dates.    The patient stated that all of her questions were answered today.  A total of 45 minutes was spent counseling and coordinating the care for this patient.  Greater than 50% of the time was spent in direct face-to-face  contact.

## 2024-03-05 ENCOUNTER — Other Ambulatory Visit (HOSPITAL_COMMUNITY)
Admission: RE | Admit: 2024-03-05 | Discharge: 2024-03-05 | Disposition: A | Discharge status: Home / Self Care | Payer: MEDICAID | Source: Ambulatory Visit | Attending: Obstetrics and Gynecology | Admitting: Obstetrics and Gynecology

## 2024-03-05 ENCOUNTER — Encounter: Payer: Self-pay | Admitting: Family Medicine

## 2024-03-05 ENCOUNTER — Encounter: Payer: Self-pay | Admitting: Obstetrics and Gynecology

## 2024-03-05 ENCOUNTER — Encounter: Payer: Self-pay | Admitting: *Deleted

## 2024-03-05 ENCOUNTER — Ambulatory Visit (INDEPENDENT_AMBULATORY_CARE_PROVIDER_SITE_OTHER): Payer: MEDICAID | Admitting: Obstetrics and Gynecology

## 2024-03-05 ENCOUNTER — Other Ambulatory Visit: Payer: Self-pay

## 2024-03-05 VITALS — BP 108/75 | HR 102 | Wt 158.0 lb

## 2024-03-05 DIAGNOSIS — Z3492 Encounter for supervision of normal pregnancy, unspecified, second trimester: Secondary | ICD-10-CM | POA: Diagnosis present

## 2024-03-05 DIAGNOSIS — O0930 Supervision of pregnancy with insufficient antenatal care, unspecified trimester: Secondary | ICD-10-CM

## 2024-03-05 DIAGNOSIS — O09522 Supervision of elderly multigravida, second trimester: Secondary | ICD-10-CM

## 2024-03-05 DIAGNOSIS — O283 Abnormal ultrasonic finding on antenatal screening of mother: Secondary | ICD-10-CM | POA: Diagnosis not present

## 2024-03-05 DIAGNOSIS — Z3A20 20 weeks gestation of pregnancy: Secondary | ICD-10-CM

## 2024-03-05 DIAGNOSIS — O0932 Supervision of pregnancy with insufficient antenatal care, second trimester: Secondary | ICD-10-CM | POA: Diagnosis not present

## 2024-03-05 DIAGNOSIS — O09529 Supervision of elderly multigravida, unspecified trimester: Secondary | ICD-10-CM | POA: Insufficient documentation

## 2024-03-05 NOTE — Progress Notes (Signed)
 INITIAL PRENATAL VISIT NOTE  Subjective:  Gabrielle Gibson is a 33 y.o. [id number] at [redacted]w[redacted]d by second trimester ultasound being seen today for her initial prenatal visit.  She has an obstetric history significant for SVD x 3. She has an uncomplicated medical history .  Patient reports no complaints.   . Vag. Bleeding: None.  Movement: Present. Denies leaking of fluid.    Past Medical History:  Diagnosis Date   Medical history non-contributory     Past Surgical History:  Procedure Laterality Date   surgery     surgery on head due to head injury    OB History  Gravida Para Term Preterm AB Living  5 3 3  1 3   SAB IAB Ectopic Multiple Live Births  1    3    # Outcome Date GA Lbr Len/2nd Weight Sex Type Anes PTL Lv  5 Current           4 SAB 2021 [redacted]w[redacted]d         3 Term 05/08/10 [redacted]w[redacted]d  9 lb 13 oz (4.451 kg) F Vag-Spont None  LIV     Birth Comments: wnl  2 Term 12/20/08 [redacted]w[redacted]d  7 lb 1 oz (3.204 kg) M Vag-Spont EPI  LIV     Birth Comments: wnl  1 Term 10/16/08 [redacted]w[redacted]d  7 lb 13 oz (3.544 kg) M Vag-Spont EPI  LIV     Birth Comments: wnl    Social History   Socioeconomic History   Marital status: Single    Spouse name: Not on file   Number of children: Not on file   Years of education: Not on file   Highest education level: Not on file  Occupational History   Not on file  Tobacco Use   Smoking status: Never   Smokeless tobacco: Never  Vaping Use   Vaping status: Never Used  Substance and Sexual Activity   Alcohol use: Never   Drug use: Never   Sexual activity: Yes    Birth control/protection: None  Other Topics Concern   Not on file  Social History Narrative   Not on file   Social Drivers of Health   Financial Resource Strain: Not on file  Food Insecurity: No Food Insecurity (02/26/2024)   Hunger Vital Sign    Worried About Running Out of Food in the Last Year: Never true    Ran Out of Food in the Last Year: Never true  Transportation Needs: No Transportation Needs  (02/26/2024)   PRAPARE - Administrator, Civil Service (Medical): No    Lack of Transportation (Non-Medical): No  Physical Activity: Not on file  Stress: Not on file  Social Connections: Not on file    Family History  Problem Relation Age of Onset   Hypertension Mother    Cervical cancer Mother    Cerebral aneurysm Mother      Current Outpatient Medications:    Blood Pressure Monitoring (BLOOD PRESSURE KIT) DEVI, 1 Device by Does not apply route as needed., Disp: 1 each, Rfl: 0   Misc. Devices (GOJJI WEIGHT SCALE) MISC, 1 Device by Does not apply route once a week., Disp: 1 each, Rfl: 0   Prenatal 27-1 MG TABS, Take 1 tablet by mouth daily., Disp: 30 tablet, Rfl: 11  No Known Allergies  Review of Systems: Negative except for what is mentioned in HPI.  Objective:   Vitals:   03/05/24 0911  BP: 108/75  Pulse: (!) 102  Weight: 158 lb (71.7 kg)    Fetal Status: Fetal Heart Rate (bpm): 164   Movement: Present     Physical Exam: BP 108/75   Pulse (!) 102   Wt 158 lb (71.7 kg)   LMP 11/28/2023 (Approximate)   BMI 28.90 kg/m  CONSTITUTIONAL: Well-developed, well-nourished female in no acute distress.  NEUROLOGIC: Alert and oriented to person, place, and time. Normal reflexes, muscle tone coordination. No cranial nerve deficit noted. PSYCHIATRIC: Normal mood and affect. Normal behavior. Normal judgment and thought content. SKIN: Skin is warm and dry. No rash noted. Not diaphoretic. No erythema. No pallor. HENT:  Normocephalic, atraumatic, External right and left ear normal. Oropharynx is clear and moist EYES: Conjunctivae and EOM are normal.  NECK: Normal range of motion, supple, no masses CARDIOVASCULAR: Normal heart rate noted, regular rhythm RESPIRATORY: Effort and breath sounds normal, no problems with respiration noted BREASTS: deferred ABDOMEN: Soft, nontender, nondistended, gravid. GU: deferred MUSCULOSKELETAL: Normal range of motion. EXT:  No edema  and no tenderness. 2+ distal pulses.   Assessment and Plan:  Pregnancy: [id number] at [redacted]w[redacted]d by ultrasound.  1. Encounter for supervision of low-risk pregnancy in second trimester (Primary) Continue routine prenatal care  - GC/Chlamydia probe amp ([place])not at [facility] - Hemoglobin A1c - AFP, Serum, Open Spina Bifida  2. [redacted] weeks gestation of pregnancy   3. Abnormal pregnancy [place] Sac adjacent to uterus has resolved  4. Multigravida of advanced maternal age in second trimester   5. Late prenatal care    Preterm labor symptoms and general obstetric precautions including but not limited to vaginal bleeding, contractions, leaking of fluid and fetal movement were reviewed in detail with the patient.  Please refer to After Visit Summary for other counseling recommendations.   Return in about 4 weeks (around 04/02/2024) for ROB, in person.  Warden Fillers 03/05/2024 9:39 AM

## 2024-03-06 LAB — PANORAMA PRENATAL TEST FULL PANEL:PANORAMA TEST PLUS 5 ADDITIONAL MICRODELETIONS

## 2024-03-07 LAB — AFP, SERUM, OPEN SPINA BIFIDA
AFP MoM: 1.83
AFP Value: 110.5 ng/mL
Gest. Age on Collection Date: 20 wk
Maternal Age At EDD: 35.1 a
OSBR Risk 1 IN: 2380
Test Results:: NEGATIVE
Weight: 158 [lb_av]

## 2024-03-07 LAB — HEMOGLOBIN A1C
Est. average glucose Bld gHb Est-mCnc: 108 mg/dL
Hgb A1c MFr Bld: 5.4 % (ref 4.8–5.6)

## 2024-03-08 ENCOUNTER — Telehealth: Payer: Self-pay

## 2024-03-08 LAB — GC/CHLAMYDIA PROBE AMP (~~LOC~~) NOT AT ARMC
Chlamydia: NEGATIVE
Comment: NEGATIVE
Comment: NORMAL
Neisseria Gonorrhea: NEGATIVE

## 2024-03-08 NOTE — Telephone Encounter (Signed)
 I spoke with the patient that Panorama NIPS was not performed due to insufficient blood volume in the tubes. I offered her to have her blood drawn again. She said she cannot come to our clinic before her next appointment because of work and would like to have it done at her next appointment. I also informed her that she can have it done at her OB office.  Sheppard Plumber, MS, [facility] Certified Genetic Counselor [facility] F[facility] [phone]

## 2024-03-30 ENCOUNTER — Inpatient Hospital Stay (HOSPITAL_COMMUNITY)
Admission: AD | Admit: 2024-03-30 | Discharge: 2024-03-30 | Disposition: A | Discharge status: Home / Self Care | Payer: MEDICAID | Attending: Obstetrics and Gynecology | Admitting: Obstetrics and Gynecology

## 2024-03-30 ENCOUNTER — Encounter (HOSPITAL_COMMUNITY): Payer: Self-pay | Admitting: Obstetrics and Gynecology

## 2024-03-30 DIAGNOSIS — B9689 Other specified bacterial agents as the cause of diseases classified elsewhere: Secondary | ICD-10-CM | POA: Insufficient documentation

## 2024-03-30 DIAGNOSIS — R519 Headache, unspecified: Secondary | ICD-10-CM | POA: Insufficient documentation

## 2024-03-30 DIAGNOSIS — Z3A24 24 weeks gestation of pregnancy: Secondary | ICD-10-CM | POA: Insufficient documentation

## 2024-03-30 DIAGNOSIS — O26892 Other specified pregnancy related conditions, second trimester: Secondary | ICD-10-CM | POA: Insufficient documentation

## 2024-03-30 DIAGNOSIS — O23592 Infection of other part of genital tract in pregnancy, second trimester: Secondary | ICD-10-CM | POA: Diagnosis not present

## 2024-03-30 DIAGNOSIS — N949 Unspecified condition associated with female genital organs and menstrual cycle: Secondary | ICD-10-CM

## 2024-03-30 DIAGNOSIS — N760 Acute vaginitis: Secondary | ICD-10-CM

## 2024-03-30 DIAGNOSIS — R102 Pelvic and perineal pain: Secondary | ICD-10-CM | POA: Diagnosis present

## 2024-03-30 DIAGNOSIS — O99352 Diseases of the nervous system complicating pregnancy, second trimester: Secondary | ICD-10-CM | POA: Insufficient documentation

## 2024-03-30 LAB — URINALYSIS, ROUTINE W REFLEX MICROSCOPIC
Bilirubin Urine: NEGATIVE
Glucose, UA: NEGATIVE mg/dL
Hgb urine dipstick: NEGATIVE
Ketones, ur: 20 mg/dL — AB
Nitrite: NEGATIVE
Protein, ur: NEGATIVE mg/dL
Specific Gravity, Urine: 1.01 (ref 1.005–1.030)
pH: 6 (ref 5.0–8.0)

## 2024-03-30 LAB — WET PREP, GENITAL
Sperm: NONE SEEN
Trich, Wet Prep: NONE SEEN
WBC, Wet Prep HPF POC: >=10 — AB (ref <10)
Yeast Wet Prep HPF POC: NONE SEEN

## 2024-03-30 MED ORDER — CYCLOBENZAPRINE HCL 5 MG PO TABS: 5.0000 mg | ORAL_TABLET | Freq: Three times a day (TID) | ORAL | 0 refills | Status: DC | PRN

## 2024-03-30 MED ORDER — ACETAMINOPHEN-CAFFEINE 500-65 MG PO TABS
2.0000 | ORAL_TABLET | Freq: Once | ORAL | Status: AC
  Administered 2024-03-30: 2 via ORAL
  Filled 2024-03-30: qty 2

## 2024-03-30 MED ORDER — METRONIDAZOLE 500 MG PO TABS: 500.0000 mg | ORAL_TABLET | Freq: Two times a day (BID) | ORAL | 0 refills | Status: AC

## 2024-03-30 MED ORDER — PROCHLORPERAZINE MALEATE 10 MG PO TABS
10.0000 mg | ORAL_TABLET | Freq: Once | ORAL | Status: DC
  Filled 2024-03-30: qty 1

## 2024-03-30 MED ORDER — CYCLOBENZAPRINE HCL 5 MG PO TABS
10.0000 mg | ORAL_TABLET | Freq: Once | ORAL | Status: AC
  Administered 2024-03-30: 10 mg via ORAL
  Filled 2024-03-30: qty 2

## 2024-03-30 NOTE — MAU Provider Note (Signed)
  History     CSN: [id number]  Arrival date and time: 03/30/24 1502   Event Date/Time   First Provider Initiated Contact with Patient 03/30/2024  3:35 PM   Chief Complaint  Patient presents with   Abdominal Pain    HPI  Gabrielle Gibson is a 33 y.o. [id number] at [redacted]w[redacted]d who presents to the MAU for sharp abdominal pain. Ongoing x 3-4 days. Reports mainly left sided. She denies VB, LOF, ctx. Normal FM. She tried taking Tylenol  last night w minimal relief of symptoms. No other changes to her day to day activities. Drinks 3 of her 24 oz water bottles per day. Also having mild tension type headache -- no vision changes, CP, SOB, RUQ pain, edema.  Past Medical History:  Diagnosis Date   Medical history non-contributory     Past Surgical History:  Procedure Laterality Date   surgery     surgery on head due to head injury    Family History  Problem Relation Age of Onset   Hypertension Mother    Cervical cancer Mother    Cerebral aneurysm Mother     Social History   Tobacco Use   Smoking status: Never   Smokeless tobacco: Never  Vaping Use   Vaping status: Never Used  Substance Use Topics   Alcohol use: Never   Drug use: Never    Allergies: No Known Allergies  No medications prior to admission.    ROS reviewed and pertinent positives and negatives as documented in HPI.  Physical Exam   Blood pressure 122/75, pulse 84, temperature 98.1 F (36.7 C), temperature source Oral, resp. rate 16, height 5\' 2"  (1.575 m), weight 72.9 kg, last menstrual period 11/28/2023, SpO2 99%.  Physical Exam Constitutional:      General: She is not in acute distress.    Appearance: Normal appearance. She is not ill-appearing.  HENT:     Head: Normocephalic and atraumatic.  Cardiovascular:     Rate and Rhythm: Normal rate.  Pulmonary:     Effort: Pulmonary effort is normal.     Breath sounds: Normal breath sounds.  Abdominal:     Palpations: Abdomen is soft.     Tenderness: There is no  abdominal tenderness. There is no guarding.  Musculoskeletal:        General: Normal range of motion.  Skin:    General: Skin is warm and dry.     Findings: No rash.  Neurological:     General: No focal deficit present.     Mental Status: She is alert and oriented to person, place, and time.   EFM: 155/mod/-a/-d  MAU Course  Procedures  MDM 33 y.o. [id number] at [redacted]w[redacted]d presenting for abdominal pain c/w round ligament pain. Pain improved w Flexeril  and headache better w Excedrin. Her vitals and exam were reassuring. Offered Compazine  as pt has not been compliant w abx for recent UTI, pt declined. Wet prep pos for BV, will tx. Discharged in improved condition w Flexeril  and Flagyl . Return precautions given.  Assessment and Plan     ICD-10-CM   1. BV (bacterial vaginosis)  N76.0    B96.89     2. Round ligament pain  N94.9     Rec heat, stretching, and Rx for Flexeril  sent Rx for Flagyl  sent for BV Stable for d/c   Melanie Spires, MD OB Fellow, Faculty Practice [facility]  03/30/2024, 8:30 PM

## 2024-03-30 NOTE — MAU Note (Signed)
 Gabrielle Gibson is a 33 y.o. at [redacted]w[redacted]d here in MAU reporting: been having sharp pains in her lower abd (rt and left side) for the last 3 days.  Been taking Tylenol , but it's not helping.  No bleeding or LOF. Reports feeling some movement. Denies diarrhea or constipation. Onset of complaint: 3 days ago Pain score: 10 Vitals:   03/30/24 1537  BP: 119/78  Pulse: 92  Resp: 17  Temp: 98.2 F (36.8 C)  SpO2: 99%     FHT:157 Lab orders placed from triage:  urine

## 2024-03-31 LAB — GC/CHLAMYDIA PROBE AMP (~~LOC~~) NOT AT ARMC
Chlamydia: NEGATIVE
Comment: NEGATIVE
Comment: NORMAL
Neisseria Gonorrhea: NEGATIVE

## 2024-04-01 ENCOUNTER — Ambulatory Visit: Payer: MEDICAID | Admitting: Advanced Practice Midwife

## 2024-04-01 ENCOUNTER — Encounter: Payer: Self-pay | Admitting: Family Medicine

## 2024-04-01 ENCOUNTER — Other Ambulatory Visit: Payer: Self-pay

## 2024-04-01 VITALS — BP 134/78 | HR 97 | Wt 162.6 lb

## 2024-04-01 DIAGNOSIS — Z2839 Other underimmunization status: Secondary | ICD-10-CM

## 2024-04-01 DIAGNOSIS — O2342 Unspecified infection of urinary tract in pregnancy, second trimester: Secondary | ICD-10-CM | POA: Diagnosis not present

## 2024-04-01 DIAGNOSIS — Z3A24 24 weeks gestation of pregnancy: Secondary | ICD-10-CM

## 2024-04-01 DIAGNOSIS — O09899 Supervision of other high risk pregnancies, unspecified trimester: Secondary | ICD-10-CM | POA: Insufficient documentation

## 2024-04-01 DIAGNOSIS — O0932 Supervision of pregnancy with insufficient antenatal care, second trimester: Secondary | ICD-10-CM | POA: Diagnosis not present

## 2024-04-01 DIAGNOSIS — O0930 Supervision of pregnancy with insufficient antenatal care, unspecified trimester: Secondary | ICD-10-CM

## 2024-04-01 DIAGNOSIS — O09892 Supervision of other high risk pregnancies, second trimester: Secondary | ICD-10-CM

## 2024-04-01 DIAGNOSIS — Z3492 Encounter for supervision of normal pregnancy, unspecified, second trimester: Secondary | ICD-10-CM

## 2024-04-01 NOTE — Addendum Note (Signed)
 Addended by: Felipe Horton, Jaelani Posa  on: 04/01/2024 02:18 PM   Modules accepted: Orders

## 2024-04-01 NOTE — Patient Instructions (Addendum)
TDaP Vaccine Pregnancy Get the Whooping Cough Vaccine While You Are Pregnant (CDC)  It is important for women to get the whooping cough vaccine in the third trimester of each pregnancy. Vaccines are the best way to prevent this disease. There are 2 different whooping cough vaccines. Both vaccines combine protection against whooping cough, tetanus and diphtheria, but they are for different age groups: Tdap: for everyone 11 years or older, including pregnant women  DTaP: for children 2 months through 6 years of age  You need the whooping cough vaccine during each of your pregnancies The recommended time to get the shot is during your 27th through 36th week of pregnancy, preferably during the earlier part of this time period. The Centers for Disease Control and Prevention (CDC) recommends that pregnant women receive the whooping cough vaccine for adolescents and adults (called Tdap vaccine) during the third trimester of each pregnancy. The recommended time to get the shot is during your 27th through 36th week of pregnancy, preferably during the earlier part of this time period. This replaces the original recommendation that pregnant women get the vaccine only if they had not previously received it. The American College of Obstetricians and Gynecologists and the American College of Nurse-Midwives support this recommendation.  You should get the whooping cough vaccine while pregnant to pass protection to your baby frame support disabled and/or not supported in this browser  Learn why Gabrielle Gibson decided to get the whooping cough vaccine in her 3rd trimester of pregnancy and how her baby girl was born with some protection against the disease. Also available on YouTube. After receiving the whooping cough vaccine, your body will create protective antibodies (proteins produced by the body to fight off diseases) and pass some of them to your baby before birth. These antibodies provide your baby some short-term  protection against whooping cough in early life. These antibodies can also protect your baby from some of the more serious complications that come along with whooping cough. Your protective antibodies are at their highest about 2 weeks after getting the vaccine, but it takes time to pass them to your baby. So the preferred time to get the whooping cough vaccine is early in your third trimester. The amount of whooping cough antibodies in your body decreases over time. That is why CDC recommends you get a whooping cough vaccine during each pregnancy. Doing so allows each of your babies to get the greatest number of protective antibodies from you. This means each of your babies will get the best protection possible against this disease.  Getting the whooping cough vaccine while pregnant is better than getting the vaccine after you give birth Whooping cough vaccination during pregnancy is ideal so your baby will have short-term protection as soon as he is born. This early protection is important because your baby will not start getting his whooping cough vaccines until he is 2 months old. These first few months of life are when your baby is at greatest risk for catching whooping cough. This is also when he's at greatest risk for having severe, potentially life-threating complications from the infection. To avoid that gap in protection, it is best to get a whooping cough vaccine during pregnancy. You will then pass protection to your baby before he is born. To continue protecting your baby, he should get whooping cough vaccines starting at 2 months old. You may never have gotten the Tdap vaccine before and did not get it during this pregnancy. If so, you should make sure   to get the vaccine immediately after you give birth, before leaving the hospital or birthing center. It will take about 2 weeks before your body develops protection (antibodies) in response to the vaccine. Once you have protection from the vaccine,  you are less likely to give whooping cough to your newborn while caring for him. But remember, your baby will still be at risk for catching whooping cough from others. A recent study looked to see how effective Tdap was at preventing whooping cough in babies whose mothers got the vaccine while pregnant or in the hospital after giving birth. The study found that getting Tdap between 27 through 36 weeks of pregnancy is 85% more effective at preventing whooping cough in babies younger than 2 months old. Blood tests cannot tell if you need a whooping cough vaccine There are no blood tests that can tell you if you have enough antibodies in your body to protect yourself or your baby against whooping cough. Even if you have been sick with whooping cough in the past or previously received the vaccine, you still should get the vaccine during each pregnancy. Breastfeeding may pass some protective antibodies onto your baby By breastfeeding, you may pass some antibodies you have made in response to the vaccine to your baby. When you get a whooping cough vaccine during your pregnancy, you will have antibodies in your breast milk that you can share with your baby as soon as your milk comes in. However, your baby will not get protective antibodies immediately if you wait to get the whooping cough vaccine until after delivering your baby. This is because it takes about 2 weeks for your body to create antibodies. Learn more about the health benefits of breastfeeding.   [place] [facility] Providers  [place] ([address]) [facility] Brown, MD; Chambliss, MD; Eniola, MD; Hensel, MD; McDiarmid, MD; McIntyer, MD [address] [place], [place] [address] [phone] Mon-Fri 8:30-12:30, 1:30-5:00  Providers come to see babies during newborn hospitalization Only accepting infants of Mother's who are seen at Family Medicine Center or have siblings seen at   Family Medicine  Center Medicaid - Yes; Tricare - Yes   [facility] Mulberry, MD [address] [place], [place] [address] [phone] Mon, Tue, Thur, Fri 8:30-5:00, Wed 10:00-7:00 (closed 1-2pm daily for lunch) Takes [place] residents with no insurance.  [place] [facility] only with Medicaid/insurance; Tricare - no  [facility] Ben-Davies, MD; Brown, MD; Chandler, MD; Ettefagh, MD; Grant, MD; Hanvey, MD; Herrin, MD; Jones,  MD; Lester, MD; McCormick, MD; McQueen, MD; Simha, MD; Stanley, MD; Stryffeler, NP [address], [place], [place] [address] [phone] Mon, Tue, Thur, Fri 8:30-5:30, Wed 9:30-5:30, Sat 8:30-12:30 Only accepting infants of first-time parents or siblings of current patients Hospital discharge coordinator will make follow-up appointment Medicaid - yes; Tricare - yes  [place] ([address]) [facility] Cox, MD; Davis, MD; Dovico, MD; Ettefaugh, MD; Lowe, MD; Nation, MD; Slimp, MD; Sumner, MD; Williams, MD [address], [place], [place] [address] [phone] Mon-Fri 8:30-5:00, closed for lunch 12:30-1:30; Sat-Sun 10:00-1:00 Accepting Newborns with commercial insurance only, must call prior to delivery to be accepted into  practice.  Medicaid - no, Tricare - yes   [facility] [address] [place],  [address] [phone] or [phone] Mon to Fri 8am to 10pm, Sat 8am to 1pm (virtual only on weekends) Only accepts Medicaid Healthy   Blue pts  [facility]  Artis, MD; Coccaro, MD; [place] Gardner, MD; Netherton, NP; Roper, MD; Wilmot, PA-C; Skinner, MD [address] [place], [place] [address] [phone] Mon-Fri 8:30-5:30 Medicaid - yes, Tricare - yes  [place] ([address]) [facility] Warner, MD [address], [place], [place]  [address] [phone] Mon, Tues, Wed Fri 8:30-5:00, Sat 8:30-12:00, Closed Thursdays Accepting siblings of established patients and first time mom's if you call prenatally Medicaid- yes; Tricare - yes  [facility] Becker, PA; Hagler, MD; Quinn, PA-C; Scifres, PA; Sun, MD; Swayne, MD;  [address], [place], [place] [address] [phone] Mon-Fri 8:30-5:00, closed for lunch 1-2 Only accepting newborns of established patients Medicaid- no; Tricare - yes  [place] ([address]) [facility] Timberlake, MD; [address], [place], [place] [address] [phone] Mon-Fri 8:00-5:00 Medicaid - No; Tricare - Yes  [facility] [employer]  Brake, NP; Wharton, PA [address], [place], [place] [address] [phone] Mon-Fri 8:00-5:00 Medicaid - No, Tricare - Yes  [facility] Gay, MD; Quinlan, MD; Blatt, DNP [address] [place], [place] [address] [phone]  Mon-Fri 8:00-5:00 Medicaid - No; Tricare - Yes  [facility] [address] [place], [place] [address] [phone] Mon-Fri 8:30-5:00 (lunch 12:00-1:00) Medicaid -Yes; Tricare - Yes  [facility] Jordan, MD [address], [place], [place] [address] [phone] Mon-Fri 8:00-5:00 Seeing newborns of current patients only. No new patients Medicaid - No, Tricare - yes  [facility] Parker, MD [address] [place], [place] [address] [phone] Mon-Fri 8:00-5:00 Medicaid -yes as secondary coverage only; Tricare - yes  [facility] Brecken, PA; Christy, NP; Dees, MD; DeClaire, MD; DeWeese, MD; Hodge, PA; Smoot, NP; Summer, MD; Vapne, MD [address] [place], [place] [address] [phone] Mon-Fri 8:30-5:00, Sat 9:00-11:00 Accepts commercial insurance ONLY. Offers free prenatal information sessions for families. Medicaid - No,  Tricare - Call first  [facility] Bouska, MD; Gordon, PA; Jeffery, PA; Weber, PA [address] [place] [address] [phone] Mon-Fri 7:30-5:30 Medicaid - Yes; Tricare - yes  [place] ([address] & [address])  [facility] Reese, MD [address] [place], [place] [address] [phone] Mon-Thur 8:00-6:00, closed for lunch 12-2, closed Fridays Medicaid - yes; Tricare - no  [facility] Anderson, NP; Badger, MD; Beal, PA; Spencer, PA [address], [place], [place] [address] [phone] Mon-Fri 7:30-4:30 Medicaid - yes, Tricare - yes  [facility]  Agbuya, MD; Klett, NP; Romgoolam, MD; Rothstein, NP [address], [place], [place] [address] [phone] Mon-Fri 8:30-5:00, closed for lunch 1-2, Sat 8:30-12:00 - sick visits only Providers come to see babies at [facility] Only accepting newborns of siblings and first time parents ONLY if who have met with office prior to delivery Medicaid -Yes; Tricare - yes  [facility] [place]  Golden, DO; Friddle, NP; Wallace, MD; Wood, MD:  [address], [place], [place] [address] [phone] Mon- Fri 8:00-5:00, Sat 9:00-12:00 - sick visits only Accepting siblings of established patients and first time mom/baby Medicaid - Yes; Tricare - yes Patients must have vaccinations (baby vaccines)  [place] ([address] & [address])  [facility] [address] [place], [place] [address] [phone] Mon-Fri 8:00-5:00 Medicaid - no; Tricare -   yes  [facility] Briscoe, MD; Schmidt, PA; Moreira, PA [address], [place], [place] [address] [phone] Mon-Fri 8:00-5:00 Medicaid- yes; Tricare - yes  [facility] [place] Boyd, MD; Jones, NP; Osborn, PA [address], [place], [place] [address] [phone] Mon-Fri 8:00-5:00 Medicaid - Yes; Tricare - yes  [place] ([address])  [facility] Atkinson, PA; Calderon, PA; Cummings, MD; Dillard, MD; Henrish, NP; Isenhour, DO; Martin, PA; Olson, MD; Ott, MD; Phillips, MD; Valente, PA; VanDeven, PA; Yonjof, NP [address], [place], [place] [address] [phone] Mon-Fri 8:30-5:00, Sat 9:00-12:00 - sick only Please register online [email or web address] then schedule online or call office Medicaid-Yes; Tricare -yes  [facility]  Dabrusco, MD; Dial, MD; [place] Tawakoni, MD; Fleenor, NP; Goolsby, PA; Tonuzi, MD; Turner, NP; West, MD [address], [place], [place] [address] [phone] Mon-Fri 8:00-5:30, Sat&Sun by appointment (phones open at 8:30) Medicaid - Yes; Tricare - yes  [place] ([address]) [facility] Allen, CPNP; Bates, MD; Gordon, MD; Mills, NP; Weinshilboum, DO [address], [place], [place] [address] [phone] M-F 8:00 - 5:15, Sat/Sun 9-12 sick visits only Medicaid - No; Tricare - yes  [facility] [place] [facility]  Brown, PA-C; Cowen, PA-C; Dennis, DO; Fuster, PA-C; Martin, PA-C; Shelton, PA-C; Spry, MD [address] [place], [place] [address] [phone] Mon-Thur 8:00-7:00, Fri 8:00-5:00 Accepting Medicaid for 13 and under only   [facility] [place] [facility] [place] Hayes, FNP; List, FNP; Moran, MD; Pitonzo, PA-C; Scholer, MD; Spangle, FNP; Nzenwa, FNP; Jasper, MD; Moran, MD [address] [place], [place] [address] [phone] Mon-Fri 8:30-5:30 Medicaid - Yes; Tricare - yes  [facility] [address]  Kelly, CPNP; Logan, MD; Poth, MD; Ramadoss, MD; Staton, NP [address], [place], [place] [address] [phone] Mon-Thur 8:00-5:30, Fri  8:00-5:00, Sat 9:00-12:00 Medicaid - yes, Tricare - yes  [place] ([address])  [facility] [place] Masneri, DO; Meyers, MD; Nelson, PA [address], [place], [place] [address] [phone] Mon-Fri 8:00-5:00, closed for lunch 12-1 Medicaid - No; Tricare - yes  [facility] [place] McGowen, MD [address], [place], [place] [address] [phone] Mon-Fri 8:00-5:00 Medicaid - No; Tricare - yes  [facility] [place] MacDonald, MD; Nayak, MD; Kearns, MD; Jones, MD [address] [place] [address], [place], [place] [address] [phone] Mon-Fri 8:00-5:00 Medicaid- Yes; Tricare - yes  [place] ([address])  [facility] [place], PA-C; Tabori, MD [address], [place], [place] [address] [phone] Mon-Fri 8:00-5:00 Medicaid - No; Tricare - yes  [facility] [place]  Margin - CPNP [address], [place], [place] [address] [phone] Mon-Weds 8:00-6:00, Thurs-Fri 8:00-5:00, Sat 9:00-12:00 Medicaid - yes; Tricare - yes   [facility] [place] Aubuchon, MD; Brandon, PA [address] [place], [place] [address] [phone] Mon-Fri 8:00-5:00 Medicaid - yes; Tricare - yes  [place] [facility] Prov[facility]  [facility] [address], [place],  [address] [phone] M, Thur: 8am -8pm, Tues, Weds: 8am - 5pm; Fri: 8-1 Medicaid - Yes; Tricare - yes  [facility] Mertz, MD; Johnson, MD; Wells, MD;   Downs, PA; Hockenberger, PA [address], [place], [place] [address] [phone] M-F 8:30 - 5:00 Medicaid - Call office; Tricare -yes  [facility] Bonney, MD; Page, MD, Minter, MD; Mueller, PNP; Thomason, NP [address], [place], [place] [address] [phone] M-F 8:30 - 5:00, Sat/Sun 8:30 - 12:30 (sick visits) Medicaid - Call office; Tricare -yes  [facility] Lewis, MD;  Shaub, PNP; Boylston, MD; Quaile, PA; Nonato, NP; Landon, CPNP [address], [place], [place] [address] [phone] M-F 8:30 - 5:00 Medicaid - Call office; Tricare - yes  [facility] [place] Cline, MD; Dvergsten, MD; Flores, MD; Kawatu, MD; Nogo, MD [address], [place], [place] [address] [phone] M-Thur: 8:00 - 5:00; Fri: 8:00 - 4:00 Medicaid - yes; Tricare - yes  [facility] [address] [place], [place], [place] [address] [phone] M-F: 8:30- 5:00, closed for lunch 12:30 - 1:00 Medicaid - yes; Tricare -yes  [facility] [place] [address], [place], [place] [address] [phone] M-F 8:00 - 5:00 Medicaid - yes; Tricare - yes  [facility] Johnson, DO; Rumball, DO; Wicker, NP [address] [place], [place], [place] [address] [phone] M-F 8:00 - 5:00, Closed 12-1 for lunch Medicaid - Call; Tricare - yes  [facility] Stein, MD [address], [place], [place] [address] [phone] M-F: 8:00-5:00, Sat: 8:00 - noon Medicaid - call; Tricare -yes  [place] [facility] Prov[facility]  [facility] Collins, FNP-C [address], [place], [place] [address] [phone] M-W: 8:00-5:00, Thur: 8:00 - 7:00, Fri: 8:00 - noon Medicaid - yes; Tricare - yes  [facility] [place] Adams, FNP [address], [place], [place] [address] [phone] M-F 8:00 - 5:00, Closed for lunch 12-1 Medicaid - yes; Tricare - yes  [place] [facility]  [facility] Smith, FNP, Melvin, MD, Fay, FNP-C [address], [place] [address] [place], [place], [place] [address] [phone] M-T 8:00-5:00, Wed-Fri 7:00-6:00 Medicaid - Yes; Tricare -yes  [facility] [place] Civiletti, DO; [address], [place], [place] [address] [phone] M-F 8:00 - 5:00, closed for lunch 12-1 Medicaid - Yes; Tricare - yes  [facility]  Barnes, MD; Bergdolt, MD; [place]ulfield, MD; Emrich, MD; Fiscus, MD; Hoppens, MD; Kylstra, MD, McPherson, MD; Todd, MD; Prestwood, MD; Waters, MD; Wood, MD [address], [place], [place] [address] [phone] M-F 8:00-5:00 Medicaid - yes; Tricare - yes  [facility] Cheema, MD (speaks Punjabi and Hindi) [address] [place], [place] [address] [phone] M-F: 8:30 - 5:00, closed 12:30 - 1 for lunch Medicaid - Yes; Tricare -yes  [place] [facility] Providers  [facility] Loda, MD; [place], MD; Burke, MD [address], [place], [place] [address] [phone] M-Th: 8:00 - 5:30, Fri: 8:00 - 12:00 Medicaid - yes; Tricare - yes  [facility] [place] Lookabill, NP; Meier, MD; Daffron, MD [address], [place], [place] [address] [phone] M-F: 8:00 - 5:00 Medicaid - yes; Tricare - yes  [facility] Busse, NP; Bowman, NP; Baune, NP; Entwistle, MD; Williams, MD, Huffman, NP, Ferguson, MD; Patel, DO [address], [place], [place] [address] [phone] M-F: 8:30 - 5:30p Medicaid - yes; Tricare - yes Other locations available as well  [facility] [facility] Rajan, MD; Wilson, MD; Morgan, PA-C, Domenech, PA-C; Myers, PA-C   [address], [place], [place] [address] [phone] M-W: 8:00am - 7:00pm, Thurs: 8:00am - 8:00pm; Fri: 8:00am - 5:00pm, closed daily from 12-1 for lunch Medicaid - yes; Tricare - yes  [place] [facility]  [facility] [place] Adams, MD; Crystal, FNP; Hadley, MD; Stokes, MD; Johnson, PNP; Brady, PA-C; West, PNP; Gardner, MD;  [address], [place], [place] [address] [phone] M - Fri: 8am - 5pm, Sat 9-noon Medicaid - Yes; Tricare -yes  [facility] [place] Nayak, MD; Jones, FNP; McDonald, MD; Kearns, MD [address]  [place] [address], [place], [place] [phone] M-F 8:00 - 5:00 Medicaid - call; Tricare - yes  [facility] [place] Bell, MD; Emory, PNP; Pinder, MD; Anderson, MD; Light, PA-C; Johnson, MD; Latta, MD; Saul, PNP; Rainey, MD; Clifford, MD; McClung, MD [address], [place], [place] [address] [phone] M-F 8:00am - 5:00pm; Sat. 9:00 - 11:00 Medicaid - yes; Tricare - yes  [facility] [place] Soldato-Couture, MD [address], [place], [place] [address] [phone] M-F 8:00 - 5:00 Medicaid - [place] Medicaid only; Tricare - yes  [facility] [place] Walker, MD; Davis, PNP; Ajizian, MD [address] [place] [address], [place], [place] [address] [phone] M-F 8:00 - 5:00 Medicaid - yes; Tricare - yes  [facility] [place] Barry, MD; Brown, MD, Forest, MD, Hazek, MD; Hoyle, MD; Smith, MD; [address], [place], [place] [address] [phone] M-F: 8-5 Medicaid - yes; Tricare - yes  [facility] [place] Brady, Md; Dowlen, MD; [address] [place] [address], [place], [place] [address] [phone] M-F 8-5 Medicaid - yes; Tricare - yes  [facility] Kearns, MD; Nayak, MD; Soldato-Courture, MD; Pellam-Palmer, DNP; Herring, PNP [address], [place], [place] [address] [phone] M-F 8-5 Medicaid - yes; Tricare - yes  [facility] Weathers, MD; Merritt, PA-C; Davis-PA-C; Warnimont, MD [address], [place], [place] [address] [phone] M-F 7am - 5 pm Medicaid - call; Tricare - yes  [facility] Hill, PNP; Erickson, MD; Robinson, MD [address], [place], [place] [address] [phone] M-F 8-5 Medicaid - yes; Tricare - yes  [facility] Kribbs, MD; Warner, MD; Williams, FNP; Brooks, FNP; Boles, FNP; Romblad, PA-C; [place] - FNP [address],  [place], [place] [address] [phone] M-F 8-5 Medicaid- yes; Tricare - yes  [facility] [place] [place] [place] [facility] [place] Yoder, MD; Verenes, MD; Armentrout, MD; Stewart, MD; Beasley, CPNP; Ford, MD; Erickson, MD; Rice, MD [address], [place], [place] [address] [phone] M-F: 8-5, Sat: 9-4, Sun 9-12 Medicaid - yes; Tricare - yes  [facility] Little, PNP; Davis, PNP [address], [place], [place] [address] [phone] M-F 8 - 5, closed 12-1 for lunch Medicaid - yes; Tricare - yes  [facility] Friesen, MD; Cnegia, MD; Rice, MD; Patel, DO [address], [place], [place] [address] [phone] M-F 8- 5:30 Medicaid - yes; Tricare - yes  [facility] [place] Zvolensky, MD; Ray, MD; Haas, MD [address] [address], [place], [place] [address] [phone] M: 8-7; Tues-Fri: 8-5; Sat: 9-12 Medicaid - yes; Tricare - yes  [facility] [place] Heinrich, MD; Meyer, MD; Clark, MD; Rhyne, MD; Aubuchon, MD [address], [place],  [address] [phone]   M: 8-7; Tues-Fri: 8-5; Sat: 8:30-12:30 Medicaid - yes; Tricare - yes  [facility] [place] Bista, MD; Dillard, PA [address], [place], [place] [address] [phone] Mon-Fri: 8-5 Medicaid - yes; Tricare - yes  [facility] [place] Beasley, CPNP; Mahle, CPNP; Rice, MD; Duffy, MD; Culler, MD; [address], [place], [place] [address] [phone] M-F: 8-5, closed 1-2 for lunch Medicaid - yes; Tricare - yes  [facility] Rickman, PA; Mounce, NP; Smith, MD; Jordan, CPNP; Darty, PA; Ball, MD; Wallace, MD [address] [place] [address],  [place], [place] [address] [phone] M-Thurs: 8-7; Fri: 8-6; Sat: 9-12; Sun 2-4 Medicaid - yes; Tricare - yes  [facility] Brown, MD; Shin, MD; Goodman, DNP, FNP; Sebesta, DO; [address], [place], [place] [address] [phone] M-F: 8-5 Medicaid - yes; Tricare - yes  [place] [facility] Prov[facility]  [facility] Dough, MD; Welsh, NP [address], [place], [place] [address] [phone] M - Fri: 8am - 5pm, closed for lunch 12-1 Medicaid - Yes; Tricare - yes  [facility] Manandhar, MD; Riley, MD; Sanger, DO; Vinocur, MD;Hall, PA; Walsh, PA; Campbell, NP [address], [place] [address] [phone] M-F 8:00 - 5:00, Sat 8:00 - 11:30 Medicaid - yes; Tricare - yes  [facility] Khan, MD; Redding, MD, Street, MD, Holt, MD, Burgart, MD; Rhyne, NP; Dickinson, PA;  [address], [place], [place] [address] [phone] M-F 8:10am - 5:00pm Medicaid - yes; Tricare - yes  [facility] Connors, MD; Kime, NP [address], [place], [place] [address] [phone] M-F 8:00 - 5:00 Medicaid - [place] Medicaid only; Tricare - yes  [facility] [place] Whyte, MD; Fox, NP [address], [place], [place] [address] [phone] M-F 8:00 - 5:00; Closed for lunch 12 - 1:00 Medicaid - yes; Tricare - yes  [facility] Penner, MD; Wilburn, FNP [address], [place], [place] [address] [phone] Mon 9-5; Tues/Wed 10-5; Thurs 8:30-5; Fri: 8-12:30 Medicaid - yes; Tricare - yes  [place] [facility]  [facility]  Golding, MD; Jackson, PA-C [address], [place], [place] [address] [phone] phone [phone] fax M-F 7:15 - 4:30 Medicaid - yes; Tricare - yes  [facility] Gosrani, MD; Meccariello, DO [address] [place], [place] [address] [phone] M-Fri: 8:30 - 5:00, closed for lunch everyday noon - 1pm Medicaid - Yes; Tricare - yes  [facility] Burdine, MD; Daniel, MD; Howard, MD; Sasser, MD; Boles, PA; Boyd, PA-C; Carroll, PA; McGee, PA; Skillman, PA; Wilson, PA [address] [place], [place] [address] [phone] M-Thurs: 7:30am - 7:00pm; Friday 7:30am - 4pm; Sat: 8:00 - 1:00 Medicaid - Yes; Tricare - yes  [facility] [place] Akhbari, MD; Law, MD; Qayumi, MD; Salvador, DO [address], [place], [place] [address] [phone] M-Thur: 8:00 - 5:00, Fri: 8:00 - Noon Medicaid - yes; Tricare - yes No [place] [facility]   [facility] [place] [facility] Dettinger, MD; Gottschalk, DO; Hawks, NP; Martin, NP; Morgan, NP; Milian, NP; Rakes, NP; Stacks, MD; Webster, PA [address], [place],  [address] [phone] M-F 8:00 -   5:00 Medicaid - yes; Tricare - yes  [facility] Collins, FNP-C; Bucio, FNP-C [address], [place], [place] [address] [phone] M, W, R 8:00-5:00, Tues: 8:00am - 7:00pm; Fri 8:00 - noon Medicaid - Yes; Tricare - yes  [facility] Khan, MD [address] [place] [address] [place], [place] [address] [phone]  M-Thurs 8:30-5:30, Fri: 8:30-12:30pm Medicaid - Yes; Tricare - N  

## 2024-04-01 NOTE — Progress Notes (Addendum)
   PRENATAL VISIT NOTE  Subjective:  Gabrielle Gibson is a 33 y.o. [id number] at [redacted]w[redacted]d being seen today for ongoing prenatal care.  She is currently monitored for the following issues for this low-risk pregnancy and has Supervision of low-risk pregnancy; Advanced maternal age in multigravida; Late prenatal care; and Rubella non-immune status, antepartum on their problem list.   Patient reports no complaints.  Contractions: Not present. Vag. Bleeding: None.  Movement: Present. Denies leaking of fluid.   The following portions of the patient's history were reviewed and updated as appropriate: allergies, current medications, past family history, past medical history, past social history, past surgical history and problem list.   Objective:   Vitals:   04/01/24 1339  BP: 134/78  Pulse: 97  Weight: 162 lb 9.6 oz (73.8 kg)    Fetal Status: Fetal Heart Rate (bpm): 157 Fundal Height: 25 cm Movement: Present     General:  Alert, oriented and cooperative. Patient is in no acute distress.  Skin: Skin is warm and dry. No rash noted.   Cardiovascular: Normal heart rate noted  Respiratory: Normal respiratory effort, no problems with respiration noted  Abdomen: Soft, gravid, appropriate for gestational age.  Pain/Pressure: Absent     Pelvic: Cervical exam deferred        Extremities: Normal range of motion.  Edema: None  Mental Status: Normal mood and affect. Normal behavior. Normal judgment and thought content.   Assessment and Plan:  Pregnancy: G5P3013 at [redacted]w[redacted]d 1. [redacted] weeks gestation of pregnancy (Primary) - GTT PP 2. Encounter for supervision of low-risk pregnancy in second trimester  3. Late prenatal care  4. Rubella non-immune status, antepartum - MMR PP 5. UTI (urinary tract infection) during pregnancy, second trimester - Culture, OB Urine  Preterm labor symptoms and general obstetric precautions including but not limited to vaginal bleeding, contractions, leaking of fluid and fetal  movement were reviewed in detail with the patient. Please refer to After Visit Summary for other counseling recommendations.   Return in about 4 weeks (around 04/29/2024) for ROB/GTT (Needs to be 9:00 or later) .  Future Appointments  Date Time Provider Department Center  04/14/2024 10:00 AM [facility] PROVIDER 1 [facility]  04/14/2024 10:30 AM [facility] US5 [facility]  04/22/2024  8:50 AM [facility] LAB [facility]  04/22/2024  9:15 AM Felipe Horton, Halynn Reitano , CNM [facility]    Lizett Chowning  Felipe Horton, CNM [employer] [facility] [employer]

## 2024-04-14 ENCOUNTER — Other Ambulatory Visit: Payer: Self-pay | Admitting: *Deleted

## 2024-04-14 ENCOUNTER — Ambulatory Visit: Payer: MEDICAID | Attending: Obstetrics | Admitting: Obstetrics

## 2024-04-14 ENCOUNTER — Ambulatory Visit: Payer: MEDICAID

## 2024-04-14 VITALS — BP 119/67 | HR 72

## 2024-04-14 DIAGNOSIS — O09522 Supervision of elderly multigravida, second trimester: Secondary | ICD-10-CM | POA: Diagnosis not present

## 2024-04-14 DIAGNOSIS — O0932 Supervision of pregnancy with insufficient antenatal care, second trimester: Secondary | ICD-10-CM | POA: Diagnosis present

## 2024-04-14 DIAGNOSIS — O0930 Supervision of pregnancy with insufficient antenatal care, unspecified trimester: Secondary | ICD-10-CM

## 2024-04-14 DIAGNOSIS — O26842 Uterine size-date discrepancy, second trimester: Secondary | ICD-10-CM | POA: Diagnosis present

## 2024-04-14 DIAGNOSIS — Z3A26 26 weeks gestation of pregnancy: Secondary | ICD-10-CM

## 2024-04-14 DIAGNOSIS — Z362 Encounter for other antenatal screening follow-up: Secondary | ICD-10-CM | POA: Insufficient documentation

## 2024-04-14 NOTE — Progress Notes (Signed)
 MFM Consult Note  Gabrielle Gibson is currently at 26 weeks and 4 days.  She has been followed due to advanced maternal age (33 years old at time of delivery).  She denies any problems since her last exam, although she reports continued nausea and vomiting.  The patient was unable to have her cell free DNA test drawn following her last ultrasound exam as we were unable to obtain a blood sample.  The patient reports that she still has a bruise on her arm from the attempts at venipuncture from her last exam.  She does not want to have a repeat blood draw today.  On today's exam, the overall EFW of 2 pounds 5 ounces measures at the 66 percentile for her gestational age.  There was normal amniotic fluid noted today.    The fetal biometry measurements obtained today are consistent with an [facility] of July 17, 2024.  This should be used as her final EDC.  Due to advanced maternal age, a follow-up growth scan was scheduled in 6 weeks.    The patient stated that all of her questions were answered today.  A total of 20 minutes was spent counseling and coordinating the care for this patient.  Greater than 50% of the time was spent in direct face-to-face contact.

## 2024-04-20 ENCOUNTER — Other Ambulatory Visit: Payer: Self-pay

## 2024-04-20 DIAGNOSIS — Z3A27 27 weeks gestation of pregnancy: Secondary | ICD-10-CM

## 2024-04-22 ENCOUNTER — Other Ambulatory Visit: Payer: MEDICAID

## 2024-04-22 ENCOUNTER — Other Ambulatory Visit: Payer: Self-pay

## 2024-04-22 ENCOUNTER — Encounter: Payer: Self-pay | Admitting: Family Medicine

## 2024-04-22 ENCOUNTER — Other Ambulatory Visit

## 2024-04-22 ENCOUNTER — Ambulatory Visit (INDEPENDENT_AMBULATORY_CARE_PROVIDER_SITE_OTHER): Payer: MEDICAID | Admitting: Advanced Practice Midwife

## 2024-04-22 VITALS — BP 116/79 | HR 89 | Wt 162.4 lb

## 2024-04-22 DIAGNOSIS — Z3A27 27 weeks gestation of pregnancy: Secondary | ICD-10-CM

## 2024-04-22 DIAGNOSIS — Z3492 Encounter for supervision of normal pregnancy, unspecified, second trimester: Secondary | ICD-10-CM

## 2024-04-22 DIAGNOSIS — M549 Dorsalgia, unspecified: Secondary | ICD-10-CM

## 2024-04-22 DIAGNOSIS — O09892 Supervision of other high risk pregnancies, second trimester: Secondary | ICD-10-CM | POA: Diagnosis not present

## 2024-04-22 DIAGNOSIS — O0932 Supervision of pregnancy with insufficient antenatal care, second trimester: Secondary | ICD-10-CM

## 2024-04-22 DIAGNOSIS — O162 Unspecified maternal hypertension, second trimester: Secondary | ICD-10-CM

## 2024-04-22 DIAGNOSIS — O26892 Other specified pregnancy related conditions, second trimester: Secondary | ICD-10-CM

## 2024-04-22 DIAGNOSIS — O99891 Other specified diseases and conditions complicating pregnancy: Secondary | ICD-10-CM

## 2024-04-22 DIAGNOSIS — O0930 Supervision of pregnancy with insufficient antenatal care, unspecified trimester: Secondary | ICD-10-CM

## 2024-04-22 DIAGNOSIS — O219 Vomiting of pregnancy, unspecified: Secondary | ICD-10-CM | POA: Diagnosis not present

## 2024-04-22 DIAGNOSIS — Z2839 Other underimmunization status: Secondary | ICD-10-CM

## 2024-04-22 DIAGNOSIS — O09899 Supervision of other high risk pregnancies, unspecified trimester: Secondary | ICD-10-CM

## 2024-04-22 MED ORDER — ONDANSETRON 4 MG PO TBDP: 4.0000 mg | ORAL_TABLET | Freq: Four times a day (QID) | ORAL | 1 refills | Status: DC | PRN

## 2024-04-22 NOTE — Progress Notes (Signed)
   PRENATAL VISIT NOTE  Subjective:  Gabrielle Gibson is a 33 y.o. [id number] at [redacted]w[redacted]d being seen today for ongoing prenatal care.  She is currently monitored for the following issues for this low-risk pregnancy and has Supervision of low-risk pregnancy; Advanced maternal age in multigravida; Late prenatal care; and Rubella non-immune status, antepartum on their problem list.   Patient reports no complaints.  Contractions: Not present. Vag. Bleeding: None.  Movement: Present. Denies leaking of fluid.   The following portions of the patient's history were reviewed and updated as appropriate: allergies, current medications, past family history, past medical history, past social history, past surgical history and problem list.   Objective:   Vitals:   04/22/24 0940 04/22/24 1100  BP: (!) 145/88 116/79  Pulse: 77 89  Weight: 162 lb 6.4 oz (73.7 kg)     Fetal Status: Fetal Heart Rate (bpm): 147   Movement: Present     General:  Alert, oriented and cooperative. Patient is in no acute distress.  Skin: Skin is warm and dry. No rash noted.   Cardiovascular: Normal heart rate noted  Respiratory: Normal respiratory effort, no problems with respiration noted  Abdomen: Soft, gravid, appropriate for gestational age.  Pain/Pressure: Present     Pelvic: Cervical exam deferred        Extremities: Normal range of motion.  Edema: None  Mental Status: Normal mood and affect. Normal behavior. Normal judgment and thought content.   Assessment and Plan:  Pregnancy: A5W0981 at [redacted]w[redacted]d 1. Encounter for supervision of low-risk pregnancy in second trimester (Primary)  2. Rubella non-immune status, antepartum  3. Late prenatal care  4. [redacted] weeks gestation of pregnancy  5. Nausea/vomiting in pregnancy - ondansetron (ZOFRAN-ODT) 4 MG disintegrating tablet; Take 1 tablet (4 mg total) by mouth every 6 (six) hours as needed for nausea.  Dispense: 30 tablet; Refill: 1  6. Back pain affecting pregnancy -  Ambulatory referral to Physical Therapy  7. Elevated blood pressure affecting pregnancy in second trimester, antepartum - Comp Met (CMET) - Protein / creatinine ratio, urine  Preterm labor symptoms and general obstetric precautions including but not limited to vaginal bleeding, contractions, leaking of fluid and fetal movement were reviewed in detail with the patient. Please refer to After Visit Summary for other counseling recommendations.   Return in about 2 weeks (around 05/06/2024) for ROB.  Future Appointments  Date Time Provider Department Center  05/26/2024 11:00 AM [facility] PROVIDER 1 [facility]  05/26/2024 11:30 AM [facility] US3 [facility]    Jaeleen Inzunza  Felipe Horton, [facility] for [employer]

## 2024-04-22 NOTE — Patient Instructions (Addendum)
 Maternity Support [place]

## 2024-04-23 ENCOUNTER — Ambulatory Visit: Payer: MEDICAID | Admitting: Physical Therapy

## 2024-04-23 LAB — CBC
Hematocrit: 33.2 % — ABNORMAL LOW (ref 34.0–46.6)
Hemoglobin: 10 g/dL — ABNORMAL LOW (ref 11.1–15.9)
MCH: 26.8 pg (ref 26.6–33.0)
MCHC: 30.1 g/dL — ABNORMAL LOW (ref 31.5–35.7)
MCV: 89 fL (ref 79–97)
Platelets: 246 10*3/uL (ref 150–450)
RBC: 3.73 x10E6/uL — ABNORMAL LOW (ref 3.77–5.28)
RDW: 12.8 % (ref 11.7–15.4)
WBC: 5.6 10*3/uL (ref 3.4–10.8)

## 2024-04-23 LAB — PROTEIN / CREATININE RATIO, URINE
Creatinine, Urine: 133.3 mg/dL
Protein, Ur: 28.9 mg/dL
Protein/Creat Ratio: 217 mg/g{creat} — ABNORMAL HIGH (ref 0–200)

## 2024-04-23 LAB — GLUCOSE TOLERANCE, 2 HOURS W/ 1HR
Glucose, 1 hour: 72 mg/dL (ref 70–179)
Glucose, 2 hour: 76 mg/dL (ref 70–152)
Glucose, Fasting: 78 mg/dL (ref 70–91)

## 2024-04-23 LAB — RPR: RPR Ser Ql: NONREACTIVE

## 2024-04-23 LAB — HIV ANTIBODY (ROUTINE TESTING W REFLEX): HIV Screen 4th Generation wRfx: NONREACTIVE

## 2024-04-23 NOTE — Therapy (Incomplete)
 OUTPATIENT PHYSICAL THERAPY THORACOLUMBAR EVALUATION   Patient Name: Gabrielle Gibson MRN: [id number] DOB:10/15/1991, 33 y.o., female Today's Date: 04/23/2024  END OF SESSION:   Past Medical History:  Diagnosis Date   Medical history non-contributory    Past Surgical History:  Procedure Laterality Date   surgery     surgery on head due to head injury   Patient Active Problem List   Diagnosis Date Noted   Rubella non-immune status, antepartum 04/01/2024   Advanced maternal age in multigravida 03/05/2024   Late prenatal care 03/05/2024   Supervision of low-risk pregnancy 02/26/2024    PCP: [facility]  REFERRING PROVIDER: Felipe Horton, [place]  CNM  REFERRING DIAG: O99.891, M54.9 back pain affecting pregnancy  Rationale for Evaluation and Treatment: Rehabilitation  THERAPY DIAG:  Back pain; weakness  ONSET DATE: ***  SUBJECTIVE:                                                                                                                                                                                           SUBJECTIVE STATEMENT: *** 27 weeks 5 days pregnant  PERTINENT HISTORY:  ***  PAIN:  Are you having pain? Yes NPRS scale: ***/10 Pain location: *** Pain orientation: {Pain Orientation:25161}  PAIN TYPE: {type:313116} Pain description: {PAIN DESCRIPTION:21022940}  Aggravating factors: *** Relieving factors: ***  PRECAUTIONS: pregnancy   WEIGHT BEARING RESTRICTIONS: No  FALLS:  Has patient fallen in last 6 months? No  LIVING ENVIRONMENT: Lives with: {OPRC lives with:25569::"lives with their family"} Lives in: House/apartment Stairs: {opstairs:[address]}   OCCUPATION: ***  PLOF: Independent  PATIENT GOALS: ***   OBJECTIVE:  Note: Objective measures were completed at Evaluation unless otherwise noted.   PATIENT SURVEYS:  Modified Oswestry ***   COGNITION: Overall cognitive status: Within functional limits for tasks  assessed     SENSATION: {sensation:27233}   POSTURE: {posture:25561}  PALPATION: ***  LUMBAR ROM:   AROM eval  Flexion   Extension   Right lateral flexion   Left lateral flexion   Right rotation   Left rotation    (Blank rows = not tested)  LOWER EXTREMITY ROM:     LOWER EXTREMITY MMT:    LUMBAR SPECIAL TESTS:  {lumbar special test:25242}  FUNCTIONAL TESTS:  {Functional tests:24029}  GAIT: decreased gait speed/antalgia   TREATMENT DATE: 04/23/24 evaluation  PATIENT EDUCATION:  Education details: Educated patient on anatomy and physiology of current symptoms, prognosis, plan of care as well as initial self care strategies to promote recovery Person educated: Patient Education method: Explanation Education comprehension: verbalized understanding  HOME EXERCISE PROGRAM: ***  ASSESSMENT:  CLINICAL IMPRESSION: Patient is a 33 y.o. female who was seen today for physical therapy evaluation and treatment for back pain affecting pregnancy.    OBJECTIVE IMPAIRMENTS: {opptimpairments:25111}.   ACTIVITY LIMITATIONS: {activitylimitations:27494}  PARTICIPATION LIMITATIONS: {participationrestrictions:25113}  PERSONAL FACTORS: {Personal factors:25162} are also affecting patient's functional outcome.   REHAB POTENTIAL: Good  CLINICAL DECISION MAKING: Stable/uncomplicated  EVALUATION COMPLEXITY: Low   GOALS: Goals reviewed with patient? Yes  SHORT TERM GOALS: Target date: 06/04/2024    The patient will demonstrate knowledge of basic self care strategies and exercises to promote healing  Baseline: Goal status: INITIAL  2.  *** Baseline:  Goal status: INITIAL  3.  *** Baseline:  Goal status: INITIAL   LONG TERM GOALS: Target date: 07/16/2024    The patient will be independent in a safe self progression of a home exercise  program to promote further recovery of function  Baseline:  Goal status: INITIAL  2.  *** Baseline:  Goal status: INITIAL  3.  *** Baseline:  Goal status: INITIAL  4.  *** Baseline:  Goal status: INITIAL  5.  Modified Oswestry functional outcome measure score improved to     % indicating improved function with ADLS with less pain.   Baseline:  Goal status: INITIAL   PLAN:  PT FREQUENCY: 1x/week  PT DURATION: 12 weeks  PLANNED INTERVENTIONS: 97164- PT Re-evaluation, 97110-Therapeutic exercises, 97530- Therapeutic activity, [id number]- Neuromuscular re-education, 97535- Self Care, 40981- Manual therapy, [phone]- Aquatic Therapy, Patient/Family education, Taping, Dry Needling, Spinal mobilization, Cryotherapy, and Moist heat.  PLAN FOR NEXT SESSION: ***

## 2024-04-29 ENCOUNTER — Ambulatory Visit: Payer: Self-pay | Admitting: Advanced Practice Midwife

## 2024-04-29 DIAGNOSIS — D509 Iron deficiency anemia, unspecified: Secondary | ICD-10-CM

## 2024-04-29 MED ORDER — FERROUS SULFATE 325 (65 FE) MG PO TBEC: 325.0000 mg | DELAYED_RELEASE_TABLET | ORAL | 3 refills | Status: DC

## 2024-05-05 ENCOUNTER — Ambulatory Visit: Payer: MEDICAID | Admitting: Physician Assistant

## 2024-05-05 VITALS — BP 123/86 | HR 86 | Wt 162.4 lb

## 2024-05-05 DIAGNOSIS — Z3493 Encounter for supervision of normal pregnancy, unspecified, third trimester: Secondary | ICD-10-CM

## 2024-05-05 DIAGNOSIS — D649 Anemia, unspecified: Secondary | ICD-10-CM

## 2024-05-05 DIAGNOSIS — O0930 Supervision of pregnancy with insufficient antenatal care, unspecified trimester: Secondary | ICD-10-CM

## 2024-05-05 DIAGNOSIS — O219 Vomiting of pregnancy, unspecified: Secondary | ICD-10-CM | POA: Diagnosis not present

## 2024-05-05 DIAGNOSIS — O99013 Anemia complicating pregnancy, third trimester: Secondary | ICD-10-CM | POA: Diagnosis not present

## 2024-05-05 DIAGNOSIS — Z3A29 29 weeks gestation of pregnancy: Secondary | ICD-10-CM | POA: Diagnosis not present

## 2024-05-05 MED ORDER — ONDANSETRON 4 MG PO TBDP: 4.0000 mg | ORAL_TABLET | Freq: Four times a day (QID) | ORAL | 1 refills | Status: DC | PRN

## 2024-05-05 NOTE — Progress Notes (Signed)
   PRENATAL VISIT NOTE  Subjective:  Gabrielle Gibson is a 33 y.o. [id number] at [redacted]w[redacted]d being seen today for ongoing prenatal care.  She is currently monitored for the following issues for this low-risk pregnancy and has Supervision of low-risk pregnancy; Advanced maternal age in multigravida; Late prenatal care; and Rubella non-immune status, antepartum on their problem list.  Patient reports no complaints.  Vag. Bleeding: None.  Movement: Present. Denies leaking of fluid.   The following portions of the patient's history were reviewed and updated as appropriate: allergies, current medications, past family history, past medical history, past social history, past surgical history and problem list.   Objective:    Vitals:   05/05/24 1433  BP: 123/86  Pulse: 86  Weight: 162 lb 6.4 oz (73.7 kg)    Fetal Status:  Fetal Heart Rate (bpm): 156 Fundal Height: 30 cm Movement: Present    General: Alert, oriented and cooperative. Patient is in no acute distress.  Skin: Skin is warm and dry. No rash noted.   Cardiovascular: Normal heart rate noted  Respiratory: Normal respiratory effort, no problems with respiration noted  Abdomen: Soft, gravid, appropriate for gestational age.  Pain/Pressure: Absent     Pelvic: Cervical exam deferred        Extremities: Normal range of motion.  Edema: None  Mental Status: Normal mood and affect. Normal behavior. Normal judgment and thought content.   Assessment and Plan:  Pregnancy: A5W0981 at [redacted]w[redacted]d  1. Encounter for supervision of low-risk pregnancy in third trimester (Primary) Patient doing well, feeling regular fetal movement BP, FHR, FH appropriate   2. [redacted] weeks gestation of pregnancy Anticipatory guidance about next visits/weeks of pregnancy given.   3. Late prenatal care  4. Anemia, unspecified type 04/22/24 hgb 10.0  Unaware of need for iron. Clarified; pt with prescription at home.   Preterm labor symptoms and general obstetric precautions  including but not limited to vaginal bleeding, contractions, leaking of fluid and fetal movement were reviewed in detail with the patient.  Please refer to After Visit Summary for other counseling recommendations.   Return in about 2 weeks (around 05/19/2024) for LOB.  Future Appointments  Date Time Provider Department Center  05/05/2024  2:55 PM Raydan Schlabach E, PA-C [facility]  05/26/2024 11:00 AM [facility] PROVIDER 1 [facility]  05/26/2024 11:30 AM [facility] US3 [facility]    Luevenia Saha, PA-C

## 2024-05-15 LAB — COMPREHENSIVE METABOLIC PANEL WITH GFR
ALT: 10 IU/L (ref 0–32)
AST: 18 IU/L (ref 0–40)
Albumin: 3.8 g/dL — ABNORMAL LOW (ref 3.9–4.9)
Alkaline Phosphatase: 83 IU/L (ref 44–121)
BUN/Creatinine Ratio: 13 (ref 9–23)
BUN: 8 mg/dL (ref 6–20)
Bilirubin Total: <0.2 mg/dL (ref 0.0–1.2)
CO2: 17 mmol/L — ABNORMAL LOW (ref 20–29)
Calcium: 8.8 mg/dL (ref 8.7–10.2)
Chloride: 106 mmol/L (ref 96–106)
Creatinine, Ser: 0.63 mg/dL (ref 0.57–1.00)
Globulin, Total: 2.3 g/dL (ref 1.5–4.5)
Glucose: 78 mg/dL (ref 70–99)
Potassium: 4.2 mmol/L (ref 3.5–5.2)
Sodium: 138 mmol/L (ref 134–144)
Total Protein: 6.1 g/dL (ref 6.0–8.5)
eGFR: 119 mL/min/{1.73_m2} (ref >59)

## 2024-05-15 LAB — SPECIMEN STATUS REPORT

## 2024-05-26 ENCOUNTER — Ambulatory Visit (HOSPITAL_BASED_OUTPATIENT_CLINIC_OR_DEPARTMENT_OTHER): Payer: MEDICAID | Admitting: Maternal & Fetal Medicine

## 2024-05-26 ENCOUNTER — Inpatient Hospital Stay (HOSPITAL_COMMUNITY)
Admission: AD | Admit: 2024-05-26 | Discharge: 2024-05-26 | Disposition: A | Discharge status: Home / Self Care | Payer: MEDICAID | Attending: Obstetrics & Gynecology | Admitting: Obstetrics & Gynecology

## 2024-05-26 ENCOUNTER — Ambulatory Visit: Payer: MEDICAID

## 2024-05-26 ENCOUNTER — Inpatient Hospital Stay (HOSPITAL_COMMUNITY): Payer: MEDICAID

## 2024-05-26 ENCOUNTER — Encounter (HOSPITAL_COMMUNITY): Payer: Self-pay | Admitting: Obstetrics & Gynecology

## 2024-05-26 DIAGNOSIS — Z362 Encounter for other antenatal screening follow-up: Secondary | ICD-10-CM | POA: Diagnosis not present

## 2024-05-26 DIAGNOSIS — Z3A32 32 weeks gestation of pregnancy: Secondary | ICD-10-CM

## 2024-05-26 DIAGNOSIS — O0933 Supervision of pregnancy with insufficient antenatal care, third trimester: Secondary | ICD-10-CM

## 2024-05-26 DIAGNOSIS — O0930 Supervision of pregnancy with insufficient antenatal care, unspecified trimester: Secondary | ICD-10-CM

## 2024-05-26 DIAGNOSIS — Z3A33 33 weeks gestation of pregnancy: Secondary | ICD-10-CM | POA: Diagnosis not present

## 2024-05-26 DIAGNOSIS — O09523 Supervision of elderly multigravida, third trimester: Secondary | ICD-10-CM | POA: Diagnosis not present

## 2024-05-26 DIAGNOSIS — R1011 Right upper quadrant pain: Secondary | ICD-10-CM

## 2024-05-26 DIAGNOSIS — O219 Vomiting of pregnancy, unspecified: Secondary | ICD-10-CM | POA: Diagnosis present

## 2024-05-26 DIAGNOSIS — R109 Unspecified abdominal pain: Secondary | ICD-10-CM | POA: Insufficient documentation

## 2024-05-26 DIAGNOSIS — O09522 Supervision of elderly multigravida, second trimester: Secondary | ICD-10-CM

## 2024-05-26 DIAGNOSIS — M549 Dorsalgia, unspecified: Secondary | ICD-10-CM | POA: Diagnosis present

## 2024-05-26 DIAGNOSIS — O26893 Other specified pregnancy related conditions, third trimester: Secondary | ICD-10-CM | POA: Diagnosis not present

## 2024-05-26 DIAGNOSIS — R1084 Generalized abdominal pain: Secondary | ICD-10-CM | POA: Diagnosis present

## 2024-05-26 HISTORY — DX: Anemia, unspecified: D64.9

## 2024-05-26 LAB — COMPREHENSIVE METABOLIC PANEL WITH GFR
ALT: 14 U/L (ref 0–44)
AST: 21 U/L (ref 15–41)
Albumin: 3 g/dL — ABNORMAL LOW (ref 3.5–5.0)
Alkaline Phosphatase: 94 U/L (ref 38–126)
Anion gap: 11 (ref 5–15)
BUN: <5 mg/dL — ABNORMAL LOW (ref 6–20)
CO2: 20 mmol/L — ABNORMAL LOW (ref 22–32)
Calcium: 8.9 mg/dL (ref 8.9–10.3)
Chloride: 108 mmol/L (ref 98–111)
Creatinine, Ser: 0.67 mg/dL (ref 0.44–1.00)
GFR, Estimated: >60 mL/min (ref >60)
Glucose, Bld: 82 mg/dL (ref 70–99)
Potassium: 3.7 mmol/L (ref 3.5–5.1)
Sodium: 139 mmol/L (ref 135–145)
Total Bilirubin: 0.9 mg/dL (ref 0.0–1.2)
Total Protein: 6.6 g/dL (ref 6.5–8.1)

## 2024-05-26 LAB — CBC
HCT: 30.2 % — ABNORMAL LOW (ref 36.0–46.0)
Hemoglobin: 9.8 g/dL — ABNORMAL LOW (ref 12.0–15.0)
MCH: 27.3 pg (ref 26.0–34.0)
MCHC: 32.5 g/dL (ref 30.0–36.0)
MCV: 84.1 fL (ref 80.0–100.0)
Platelets: 218 10*3/uL (ref 150–400)
RBC: 3.59 MIL/uL — ABNORMAL LOW (ref 3.87–5.11)
RDW: 13.6 % (ref 11.5–15.5)
WBC: 6.9 10*3/uL (ref 4.0–10.5)
nRBC: 0 % (ref 0.0–0.2)

## 2024-05-26 LAB — AMYLASE: Amylase: 110 U/L — ABNORMAL HIGH (ref 28–100)

## 2024-05-26 LAB — URINALYSIS, ROUTINE W REFLEX MICROSCOPIC
Bilirubin Urine: NEGATIVE
Glucose, UA: NEGATIVE mg/dL
Hgb urine dipstick: NEGATIVE
Ketones, ur: 20 mg/dL — AB
Nitrite: NEGATIVE
Protein, ur: 30 mg/dL — AB
Specific Gravity, Urine: 1.019 (ref 1.005–1.030)
pH: 6 (ref 5.0–8.0)

## 2024-05-26 LAB — LIPASE, BLOOD: Lipase: 29 U/L (ref 11–51)

## 2024-05-26 MED ORDER — ACETAMINOPHEN 500 MG PO TABS
1000.0000 mg | ORAL_TABLET | Freq: Once | ORAL | Status: AC
  Administered 2024-05-26: 1000 mg via ORAL
  Filled 2024-05-26: qty 2

## 2024-05-26 MED ORDER — ONDANSETRON 4 MG PO TBDP
8.0000 mg | ORAL_TABLET | Freq: Once | ORAL | Status: AC
  Administered 2024-05-26: 4 mg via ORAL
  Filled 2024-05-26: qty 2

## 2024-05-26 MED ORDER — CYCLOBENZAPRINE HCL 5 MG PO TABS
10.0000 mg | ORAL_TABLET | Freq: Once | ORAL | Status: AC
  Administered 2024-05-26: 10 mg via ORAL
  Filled 2024-05-26: qty 2

## 2024-05-26 MED ORDER — ONDANSETRON 4 MG PO TBDP: 4.0000 mg | ORAL_TABLET | Freq: Three times a day (TID) | ORAL | 0 refills | Status: DC | PRN

## 2024-05-26 MED ORDER — OXYCODONE HCL 5 MG PO CAPS: 5.0000 mg | ORAL_CAPSULE | ORAL | 0 refills | Status: AC | PRN

## 2024-05-26 NOTE — MAU Note (Signed)
 Gabrielle Gibson is a 33 y.o. at [redacted]w[redacted]d here in MAU reporting: feeling real bad, hurts all over, everything, head, back and stomach.  Even when she turns it hurts. Hasn't took nothing for it.  Everything she eats, she throws up.  No bleeding or leaking.  Onset of complaint: yesterday evening Pain score: 50,60,8010 Vitals:   05/26/24 1323  BP: 121/72  Pulse: (!) 102  Resp: 16  Temp: 98.5 F (36.9 C)  SpO2: 98%     FHT:in wc.... reports +FM Lab orders placed from triage:

## 2024-05-26 NOTE — Progress Notes (Signed)
 Patient information  Patient Name: Gabrielle Gibson  Patient MRN:   [id number]  Referring practice: MFM Referring Provider: [facility])  Problem List   Patient Active Problem List   Diagnosis Date Noted   Rubella non-immune status, antepartum 04/01/2024   Advanced maternal age in multigravida 03/05/2024   Late prenatal care 03/05/2024   Supervision of low-risk pregnancy 02/26/2024   Maternal Fetal medicine Consult  Gabrielle Gibson is a 33 y.o. [id number] at [redacted]w[redacted]d here for ultrasound and consultation. Gabrielle Gibson is not doing well today.  She reports that last night she had sudden onset severe abdominal pain that radiates to her back.  She denies fevers or chills but overall feels very badly.  She reports the pain is a 20 out of 10.  She has never had any episodes like this previously.  She denies any history of bowel or intra-abdominal problems.  I discussed the importance of going to the MAU to be assessed.  I discussed my concern for a possible acute intra-abdominal process like appendicitis or viral enteritis and the need to be assessed.  Sonographic findings Single intrauterine pregnancy at 32w 4d.  Fetal cardiac activity:  Observed and appears normal. Presentation: Cephalic. Interval fetal anatomy appears normal. Fetal biometry shows the estimated fetal weight at the 23 percentile. Amniotic fluid volume: Within normal limits. MVP: 4.21 cm. Placenta: Posterior Fundal.  There are limitations of prenatal ultrasound such as the inability to detect certain abnormalities due to poor visualization. Various factors such as fetal position, gestational age and maternal body habitus may increase the difficulty in visualizing the fetal anatomy.    Recommendations - Sent to the MAU for evaluation of acute abdominal pain. - No further ultrasounds are recommended at this time based on the current indications. If future indications arise (e.g. size/date discrepancy  on fundal height, gestational diabetes or hypertension) and an ultrasound is to be desired at our MFM office, please send a referral.   Review of Systems: A review of systems was performed and was negative except per HPI   Vitals and Physical Exam    05/26/2024   10:59 AM 05/05/2024    2:33 PM 04/22/2024   11:00 AM  Vitals with BMI  Weight  162 lbs 6 oz   Systolic 120 123 147  Diastolic 66 86 79  Pulse 92 86 89  Appears uncomfortable and ill Nonlabored breathing Normal rate and rhythm Abdomen is diffusely tender  Past pregnancies OB History  Gravida Para Term Preterm AB Living  5 3 3  1 3   SAB IAB Ectopic Multiple Live Births  1    3    # Outcome Date GA Lbr Len/2nd Weight Sex Type Anes PTL Lv  5 Current           4 SAB 2021 [redacted]w[redacted]d         3 Term 05/08/10 101w0d  9 lb 13 oz (4.451 kg) F Vag-Spont None  LIV     Birth Comments: wnl  2 Term 12/20/08 [redacted]w[redacted]d  7 lb 1 oz (3.204 kg) M Vag-Spont EPI  LIV     Birth Comments: wnl  1 Term 10/16/08 [redacted]w[redacted]d  7 lb 13 oz (3.544 kg) M Vag-Spont EPI  LIV     Birth Comments: wnl     I spent 20 minutes reviewing the patients chart, including labs and images as well as counseling the patient about her medical conditions. Greater than 50% of  the time was spent in direct face-to-face patient counseling.  Gabrielle Gibson  MFM, [facility]   05/26/2024  11:50 AM

## 2024-05-26 NOTE — MAU Provider Note (Signed)
 History     CSN: [id number]  Arrival date and time: 05/26/24 1308   Event Date/Time   First Provider Initiated Contact with Patient 05/26/24 1413      Chief Complaint  Patient presents with   Abdominal Pain   HPI Ms. Gabrielle Gibson is a 33 y.o. year old G62P3013 female at [redacted]w[redacted]d weeks gestation who was sent to MAU by MFM reporting upper and lower abdominal pain. She states she is feeling real bad, hurts all over, head, back, stomach, everything hurts. She states it hurts when she turns. She has not taken anything for her symptoms. She reports N/V; unable to keep anything down. She last was able to eat something at 1900 last night. She last drank something at 1430 today. Her pain is rated a 50, 60, 80/10. She receives [facility] with [facility]; next appt is 05/28/2024. Her grandfather is present and contributing to the history taking.    OB History     Gravida  5   Para  3   Term  3   Preterm      AB  1   Living  3      SAB  1   IAB      Ectopic      Multiple      Live Births  3           Past Medical History:  Diagnosis Date   Anemia    Medical history non-contributory     Past Surgical History:  Procedure Laterality Date   surgery     surgery on head due to head injury    Family History  Problem Relation Age of Onset   Hypertension Mother    Cervical cancer Mother    Cerebral aneurysm Mother     Social History   Tobacco Use   Smoking status: Never   Smokeless tobacco: Never  Vaping Use   Vaping status: Never Used  Substance Use Topics   Alcohol use: Never   Drug use: Never    Allergies: No Known Allergies  Medications Prior to Admission  Medication Sig Dispense Refill Last Dose/Taking   ferrous sulfate  325 (65 FE) MG EC tablet Take 1 tablet (325 mg total) by mouth every other day. 30 tablet 3 05/26/2024   ondansetron  (ZOFRAN -ODT) 4 MG disintegrating tablet Take 1 tablet (4 mg total) by mouth every 6 (six) hours as needed for nausea. 30  tablet 1 05/25/2024   Prenatal 27-1 MG TABS Take 1 tablet by mouth daily. 30 tablet 11 05/26/2024   Blood Pressure Monitoring (BLOOD PRESSURE KIT) DEVI 1 Device by Does not apply route as needed. (Patient not taking: Reported on 04/01/2024) 1 each 0    Misc. Devices (GOJJI WEIGHT SCALE) MISC 1 Device by Does not apply route once a week. (Patient not taking: Reported on 04/01/2024) 1 each 0     Review of Systems  Constitutional: Negative.   HENT: Negative.    Eyes: Negative.   Respiratory: Negative.    Gastrointestinal:  Positive for abdominal pain (upper and lower), nausea and vomiting.  Endocrine: Negative.   Musculoskeletal:  Positive for back pain.  Allergic/Immunologic: Negative.   Neurological: Negative.   Hematological: Negative.   Psychiatric/Behavioral: Negative.     Physical Exam   Blood pressure 121/72, pulse (!) 102, temperature 98.5 F (36.9 C), temperature source Oral, resp. rate 16, height 5' 2 (1.575 m), weight 73.8 kg, last menstrual period 11/28/2023, SpO2 98%.  Physical Exam REACTIVE NST -  FHR: 145 bpm / moderate variability / accels present / decels absent / TOCO: UI noted Reassessment @  MAU Course  Procedures  MDM CCUA CBC CMP Amylase Lipase Flexeril  10 mg po Tylenol  1000 mg po  Zofran  8 mg ODT RUQ U/S  Results for orders placed or performed during the hospital encounter of 05/26/24 (from the past 24 hours)  Urinalysis, Routine w reflex microscopic -Urine, Clean Catch     Status: Abnormal   Collection Time: 05/26/24  1:44 PM  Result Value Ref Range   Color, Urine YELLOW YELLOW   APPearance HAZY (A) CLEAR   Specific Gravity, Urine 1.019 1.005 - 1.030   pH 6.0 5.0 - 8.0   Glucose, UA NEGATIVE NEGATIVE mg/dL   Hgb urine dipstick NEGATIVE NEGATIVE   Bilirubin Urine NEGATIVE NEGATIVE   Ketones, ur 20 (A) NEGATIVE mg/dL   Protein, ur 30 (A) NEGATIVE mg/dL   Nitrite NEGATIVE NEGATIVE   Leukocytes,Ua SMALL (A) NEGATIVE   RBC / HPF 0-5 0 - 5 RBC/hpf    WBC, UA 0-5 0 - 5 WBC/hpf   Bacteria, UA RARE (A) NONE SEEN   Squamous Epithelial / HPF 0-5 0 - 5 /HPF   Mucus PRESENT    Hyaline Casts, UA PRESENT   CBC     Status: Abnormal   Collection Time: 05/26/24  2:28 PM  Result Value Ref Range   WBC 6.9 4.0 - 10.5 K/uL   RBC 3.59 (L) 3.87 - 5.11 MIL/uL   Hemoglobin 9.8 (L) 12.0 - 15.0 g/dL   HCT 14.7 (L) 82.9 - 56.2 %   MCV 84.1 80.0 - 100.0 fL   MCH 27.3 26.0 - 34.0 pg   MCHC 32.5 30.0 - 36.0 g/dL   RDW 13.0 86.5 - 78.4 %   Platelets 218 150 - 400 K/uL   nRBC 0.0 0.0 - 0.2 %  Comprehensive metabolic panel with GFR     Status: Abnormal   Collection Time: 05/26/24  2:28 PM  Result Value Ref Range   Sodium 139 135 - 145 mmol/L   Potassium 3.7 3.5 - 5.1 mmol/L   Chloride 108 98 - 111 mmol/L   CO2 20 (L) 22 - 32 mmol/L   Glucose, Bld 82 70 - 99 mg/dL   BUN <5 (L) 6 - 20 mg/dL   Creatinine, Ser 6.96 0.44 - 1.00 mg/dL   Calcium 8.9 8.9 - 29.5 mg/dL   Total Protein 6.6 6.5 - 8.1 g/dL   Albumin 3.0 (L) 3.5 - 5.0 g/dL   AST 21 15 - 41 U/L   ALT 14 0 - 44 U/L   Alkaline Phosphatase 94 38 - 126 U/L   Total Bilirubin 0.9 0.0 - 1.2 mg/dL   GFR, Estimated >28 >41 mL/Gibson   Anion gap 11 5 - 15  Amylase     Status: Abnormal   Collection Time: 05/26/24  2:28 PM  Result Value Ref Range   Amylase 110 (H) 28 - 100 U/L  Lipase, blood     Status: None   Collection Time: 05/26/24  2:28 PM  Result Value Ref Range   Lipase 29 11 - 51 U/L   *Consult with Dr. Ozan @ 1745 - notified of patient's complaints, assessments, lab & U/S results, recommend tx plan Oxycodone for pain - ok to d/c home  Assessment and Plan  1. Right upper quadrant abdominal pain affecting pregnancy in third trimester (Primary) - Advised to eat a low fat diet in case her abdominal pain  is from gall bladder issues - prescription for: Oxycodone 5 mg every 4 hours prn pain - Return to MAU, if abdominal pain is worse and not relieved with the Oxycodone  2. Nausea/vomiting in  pregnancy - Prescription for: Zofran  4 mg ODT every 8 hrs prn N/V  3. [redacted] weeks gestation of pregnancy   - Discharge home - Keep scheduled appt with [facility] on 6/202/2025 - Patient verbalized an understanding of the plan of care and agrees.   Marithza Malachi, CNM 05/26/2024, 2:13 PM

## 2024-05-28 ENCOUNTER — Ambulatory Visit: Payer: MEDICAID | Admitting: Student

## 2024-05-28 ENCOUNTER — Other Ambulatory Visit: Payer: Self-pay

## 2024-05-28 VITALS — BP 122/78 | HR 112 | Wt 164.4 lb

## 2024-05-28 DIAGNOSIS — O09893 Supervision of other high risk pregnancies, third trimester: Secondary | ICD-10-CM

## 2024-05-28 DIAGNOSIS — O99013 Anemia complicating pregnancy, third trimester: Secondary | ICD-10-CM | POA: Diagnosis not present

## 2024-05-28 DIAGNOSIS — D649 Anemia, unspecified: Secondary | ICD-10-CM

## 2024-05-28 DIAGNOSIS — Z2839 Other underimmunization status: Secondary | ICD-10-CM

## 2024-05-28 DIAGNOSIS — Z3493 Encounter for supervision of normal pregnancy, unspecified, third trimester: Secondary | ICD-10-CM

## 2024-05-28 DIAGNOSIS — Z0289 Encounter for other administrative examinations: Secondary | ICD-10-CM

## 2024-05-28 DIAGNOSIS — Z3A32 32 weeks gestation of pregnancy: Secondary | ICD-10-CM

## 2024-05-28 LAB — CULTURE, OB URINE: Culture: 80000 — AB

## 2024-05-28 NOTE — Progress Notes (Signed)
   PRENATAL VISIT NOTE  Subjective:  Gabrielle Gibson is a 33 y.o. G5P3013 at [redacted]w[redacted]d being seen today for ongoing prenatal care.  She is currently monitored for the following issues for this low-risk pregnancy and has Supervision of low-risk pregnancy; Advanced maternal age in multigravida; Late prenatal care; and Rubella non-immune status, antepartum on their problem list.  Patient reports no complaints.  Contractions: Not present.  .  Movement: Present. Denies leaking of fluid.   The following portions of the patient's history were reviewed and updated as appropriate: allergies, current medications, past family history, past medical history, past social history, past surgical history and problem list.   Objective:    Vitals:   05/28/24 1043  BP: 122/78  Pulse: (!) 112  Weight: 164 lb 6.4 oz (74.6 kg)    Fetal Status:  Fetal Heart Rate (bpm): 136   Movement: Present    General: Alert, oriented and cooperative. Patient is in no acute distress.  Skin: Skin is warm and dry. No rash noted.   Cardiovascular: Normal heart rate noted  Respiratory: Normal respiratory effort, no problems with respiration noted  Abdomen: Soft, gravid, appropriate for gestational age.  Pain/Pressure: Absent     Pelvic: Cervical exam deferred        Extremities: Normal range of motion.  Edema: None  Mental Status: Normal mood and affect. Normal behavior. Normal judgment and thought content.   Assessment and Plan:  Pregnancy: [id number] at [redacted]w[redacted]d 1. Encounter for supervision of low-risk pregnancy in third trimester (Primary) - fetal movement   2. [redacted] weeks gestation of pregnancy - continue routine care  3. Anemia, unspecified type - continue PO iron  4. Rubella non-immune status, antepartum - MMR PP   Preterm labor symptoms and general obstetric precautions including but not limited to vaginal bleeding, contractions, leaking of fluid and fetal movement were reviewed in detail with the patient. Please  refer to After Visit Summary for other counseling recommendations.   No follow-ups on file.  Future Appointments  Date Time Provider Department Center  06/10/2024  2:35 PM Zelma Hidden, FNP [facility]  06/24/2024  1:15 PM Abigail Abler, MD [facility]    Denzil Flatten, NP

## 2024-05-29 ENCOUNTER — Other Ambulatory Visit: Payer: Self-pay | Admitting: Family Medicine

## 2024-05-29 DIAGNOSIS — O2343 Unspecified infection of urinary tract in pregnancy, third trimester: Secondary | ICD-10-CM

## 2024-05-29 MED ORDER — CEFADROXIL 500 MG PO CAPS: 500.0000 mg | ORAL_CAPSULE | Freq: Two times a day (BID) | ORAL | 0 refills | Status: AC

## 2024-06-10 ENCOUNTER — Ambulatory Visit (INDEPENDENT_AMBULATORY_CARE_PROVIDER_SITE_OTHER): Payer: MEDICAID | Admitting: Obstetrics and Gynecology

## 2024-06-10 ENCOUNTER — Other Ambulatory Visit: Payer: Self-pay

## 2024-06-10 VITALS — BP 126/84 | HR 120 | Wt 163.4 lb

## 2024-06-10 DIAGNOSIS — O99013 Anemia complicating pregnancy, third trimester: Secondary | ICD-10-CM | POA: Diagnosis not present

## 2024-06-10 DIAGNOSIS — O26893 Other specified pregnancy related conditions, third trimester: Secondary | ICD-10-CM

## 2024-06-10 DIAGNOSIS — D649 Anemia, unspecified: Secondary | ICD-10-CM

## 2024-06-10 DIAGNOSIS — Z3493 Encounter for supervision of normal pregnancy, unspecified, third trimester: Secondary | ICD-10-CM

## 2024-06-10 DIAGNOSIS — O99891 Other specified diseases and conditions complicating pregnancy: Secondary | ICD-10-CM

## 2024-06-10 DIAGNOSIS — M549 Dorsalgia, unspecified: Secondary | ICD-10-CM

## 2024-06-10 DIAGNOSIS — Z3A34 34 weeks gestation of pregnancy: Secondary | ICD-10-CM

## 2024-06-10 DIAGNOSIS — O2343 Unspecified infection of urinary tract in pregnancy, third trimester: Secondary | ICD-10-CM

## 2024-06-10 DIAGNOSIS — N390 Urinary tract infection, site not specified: Secondary | ICD-10-CM

## 2024-06-10 DIAGNOSIS — Z2839 Other underimmunization status: Secondary | ICD-10-CM

## 2024-06-10 MED ORDER — CEFADROXIL 500 MG PO CAPS
500.0000 mg | ORAL_CAPSULE | Freq: Two times a day (BID) | ORAL | 0 refills | Status: DC
Start: 2024-06-10 — End: 2024-07-08

## 2024-06-10 NOTE — Progress Notes (Signed)
   PRENATAL VISIT NOTE  Subjective:  Gabrielle Gibson is a 33 y.o. H4E6986 at [redacted]w[redacted]d being seen today for ongoing prenatal care.  She is currently monitored for the following issues for this low-risk pregnancy and has Supervision of low-risk pregnancy; Advanced maternal age in multigravida; Late prenatal care; and Rubella non-immune status, antepartum on their problem list.  Patient reports significant back painfor the past few weeks, has tried OTC tx, massage, heat pack  PT, .  Contractions: Not present. Vag. Bleeding: None.  Movement: Present. Denies leaking of fluid.   The following portions of the patient's history were reviewed and updated as appropriate: allergies, current medications, past family history, past medical history, past social history, past surgical history and problem list.   Objective:    Vitals:   06/10/24 1519  BP: 126/84  Pulse: (!) 120  Weight: 163 lb 6.4 oz (74.1 kg)    Fetal Status:  Fetal Heart Rate (bpm): 156 Fundal Height: 34 cm Movement: Present    General: Alert, oriented and cooperative. Patient is in no acute distress.  Skin: Skin is warm and dry. No rash noted.   Cardiovascular: Normal heart rate noted  Respiratory: Normal respiratory effort, no problems with respiration noted  Abdomen: Soft, gravid, appropriate for gestational age.  Pain/Pressure: Present (lower back pain)     Pelvic: Cervical exam deferred        Extremities: Normal range of motion.  Edema: None  Mental Status: Normal mood and affect. Normal behavior. Normal judgment and thought content.   Assessment and Plan:  Pregnancy: [id number] at [redacted]w[redacted]d 1. Encounter for supervision of low-risk pregnancy in third trimester (Primary) BP and FHR normal Doing well, feeling regular movement    2. [redacted] weeks gestation of pregnancy Anticipatory guidance regarding swabs next visit  3. Anemia, unspecified type Continue oral iron  4. Rubella non-immune status, antepartum Offer MMR pp   5.  Urinary tract infection in mother during third trimester of pregnancy Did not complete abx, new rx sent, check TOC next visit  - cefadroxil  (DURICEF) 500 MG capsule; Take 1 capsule (500 mg total) by mouth 2 (two) times daily.  Dispense: 14 capsule; Refill: 0  6. Back pain affecting pregnancy Has been doing OTC management, encouraged to continue PT, work note provided, Dicussed returning to work sooner if she goes out of work prior to due date, and cannot ensure pay, she is aware and okay with all of that   Preterm labor symptoms and general obstetric precautions including but not limited to vaginal bleeding, contractions, leaking of fluid and fetal movement were reviewed in detail with the patient. Please refer to After Visit Summary for other counseling recommendations.   Return in two weeks   Future Appointments  Date Time Provider Department Center  06/24/2024  1:15 PM Zina Jerilynn LABOR, MD [facility]  07/01/2024  3:15 PM Izell Harari, MD [facility]  07/09/2024  8:55 AM Zina Jerilynn LABOR, MD [facility]  07/16/2024 10:55 AM Izell Harari, MD [facility]    Nidia Daring, FNP

## 2024-06-10 NOTE — Patient Instructions (Signed)
 [facility] Pediatric Providers  [place] ([id number])  [facility]) - [facility], MD; Manson Passey, MD; Ave Filter, MD; Luna Fuse, MD; Kennedy Bucker, MD; Florestine Avers, MD; Melchor Amour, MD; Yetta Barre,  MD; Konrad Dolores, MD; Kathlene November, MD; Jenne Campus, MD; Wynetta Emery, MD; Duffy Rhody, MD; Gerre Couch, NP [address] [place], [place], [place] [address] [phone] Mon, Tue, Thur, Fri 8:30-5:30, Wed 9:30-5:30, Sat 8:30-12:30 Only accepting infants of first-time parents or siblings of current patients Hospital discharge coordinator will make follow-up appointment Medicaid - yes; Tricare - yes   [facility] - Pediatrics at [place]e Radon, MD; Sabino Dick, MD; Quitman Livings, MD; Betha Loa, NP; Claretha Cooper, MD; Lelon Perla, MD [address] [place] [place], [place] [address] [phone] Mon-Fri 8:30-5:30 Medicaid - yes, Tricare - yes  [place] [phone]) [facility], MD [address], [place], [place] [address] [phone] Mon, Tues, Wed Fri 8:30-5:00, Sat 8:30-12:00, Closed Thursdays Accepting siblings of established patients and first time mom's if you call prenatally Medicaid- yes; Tricare - yes   [facility] [address], [place], [place] [address] [phone] Mon-Fri 8:30-5:00 (lunch 12:00-1:00) Medicaid -Yes; Tricare - Yes   [facility] [place], MD; [place], [place]; [place], [place]; [place], [place] [address] [place] [address] [phone] Mon-Fri 7:30-5:30 Medicaid - Yes; Sherolyn Buba  [place] [phone] & [phone])  [facility], MD [address] [place], [place] [address] [phone] Mon-Thur 8:00-6:00, closed for lunch 12-2, closed Fridays Medicaid - yes; Tricare - no  [facility] Dareen Piano, NP; Cyndia Bent, MD; [place], [place]; [place], [place] [address], [place],  [place] [address] [phone] Mon-Fri 7:30-4:30 Medicaid - yes, Tricare - yes  [facility]  Juanito Doom, MD; Janene Harvey, NP; Vonita Moss, MD; Donn Pierini, NP [address], [place], [place] [address] [phone] Mon-Fri 8:30-5:00, closed for lunch 1-2, Sat 8:30-12:00 - sick visits only Providers come to see babies at [facility] Only accepting newborns of siblings and first time parents ONLY if who have met with office prior to delivery Medicaid -Yes; Tricare - yes  [facility] - [place], [place]; Spero Geralds, NP; Earlene Plater, MD; Lucretia Roers, MD:  [address], [place], [place] [address] [phone] Mon- Fri 8:00-5:00, Sat 9:00-12:00 - sick visits only Accepting siblings of established patients and first time mom/baby Medicaid - Yes; Tricare - yes Patients must have vaccinations (baby vaccines)   [employer] [phone])  [facility] Alfredo Bach, PA; [place], [place]; Eddie Candle, MD; Normand Sloop, MD; [place], NP; [place], DO; [place], [place]; Constance Goltz, MD; Ruthann Cancer, MD; Vear Clock, MD; [place], [place]; [place], [place]; [place], [place] [address] [facility] [address], [place], [place] [address] [phone] Mon-Fri 8:30-5:00, Sat 9:00-12:00 - sick only Please register online [email or web address] then schedule online or call office Medicaid-Yes; Tricare -yes   [facility] at [place] [facility] [place] [place], [place]; List, FNP; Berneda Rose, MD; Luther Redo, PA-C; Lavonia Drafts, MD; Kellie Simmering, FNP; Genevie Cheshire, FNP; Evaristo Bury, MD; Berneda Rose, MD [phone] [address] [place], [place] [address] [phone] Mon-Fri 8:30-5:30 Medicaid - Yes; Tricare - yes  [facility] - [address]  [place], [place]; Whitney Post, MD; Hennie Duos, MD; Wynne Dust, MD; [place], NP [address], [place], [place] [address] [phone] Mon-Thur 8:00-5:30, Fri 8:00-5:00, Sat 9:00-12:00 Medicaid - yes, Tricare - yes

## 2024-06-18 ENCOUNTER — Other Ambulatory Visit: Payer: Self-pay | Admitting: Physician Assistant

## 2024-06-18 DIAGNOSIS — O219 Vomiting of pregnancy, unspecified: Secondary | ICD-10-CM

## 2024-06-24 ENCOUNTER — Encounter: Payer: Self-pay | Admitting: Family Medicine

## 2024-06-24 ENCOUNTER — Encounter: Payer: Self-pay | Admitting: Obstetrics and Gynecology

## 2024-06-24 ENCOUNTER — Other Ambulatory Visit: Payer: Self-pay

## 2024-06-24 ENCOUNTER — Other Ambulatory Visit (HOSPITAL_COMMUNITY)
Admission: RE | Admit: 2024-06-24 | Discharge: 2024-06-24 | Disposition: A | Discharge status: Home / Self Care | Payer: MEDICAID | Source: Ambulatory Visit | Attending: Obstetrics and Gynecology | Admitting: Obstetrics and Gynecology

## 2024-06-24 ENCOUNTER — Ambulatory Visit (INDEPENDENT_AMBULATORY_CARE_PROVIDER_SITE_OTHER): Payer: MEDICAID | Admitting: Obstetrics and Gynecology

## 2024-06-24 VITALS — BP 114/83 | HR 132 | Wt 159.8 lb

## 2024-06-24 DIAGNOSIS — O0930 Supervision of pregnancy with insufficient antenatal care, unspecified trimester: Secondary | ICD-10-CM

## 2024-06-24 DIAGNOSIS — O0933 Supervision of pregnancy with insufficient antenatal care, third trimester: Secondary | ICD-10-CM

## 2024-06-24 DIAGNOSIS — Z3A36 36 weeks gestation of pregnancy: Secondary | ICD-10-CM | POA: Diagnosis not present

## 2024-06-24 DIAGNOSIS — Z3493 Encounter for supervision of normal pregnancy, unspecified, third trimester: Secondary | ICD-10-CM | POA: Insufficient documentation

## 2024-06-24 DIAGNOSIS — O2342 Unspecified infection of urinary tract in pregnancy, second trimester: Secondary | ICD-10-CM

## 2024-06-24 DIAGNOSIS — O09523 Supervision of elderly multigravida, third trimester: Secondary | ICD-10-CM

## 2024-06-24 DIAGNOSIS — Z2839 Other underimmunization status: Secondary | ICD-10-CM

## 2024-06-24 DIAGNOSIS — O09893 Supervision of other high risk pregnancies, third trimester: Secondary | ICD-10-CM | POA: Diagnosis not present

## 2024-06-24 DIAGNOSIS — O2343 Unspecified infection of urinary tract in pregnancy, third trimester: Secondary | ICD-10-CM

## 2024-06-24 NOTE — Progress Notes (Signed)
   PRENATAL VISIT NOTE  Subjective:  Gabrielle Gibson is a 33 y.o. H4E6986 at [redacted]w[redacted]d being seen today for ongoing prenatal care.  She is currently monitored for the following issues for this low-risk pregnancy and has Supervision of low-risk pregnancy; Advanced maternal age in multigravida; Late prenatal care; and Rubella non-immune status, antepartum on their problem list.  Patient reports occasional contractions.   . Vag. Bleeding: None.  Movement: Present. Denies leaking of fluid.   The following portions of the patient's history were reviewed and updated as appropriate: allergies, current medications, past family history, past medical history, past social history, past surgical history and problem list.   Objective:    Vitals:   06/24/24 1332  BP: 114/83  Pulse: (!) 132  Weight: 159 lb 12.8 oz (72.5 kg)    Fetal Status:  Fetal Heart Rate (bpm): 154 Fundal Height: 35 cm Movement: Present    General: Alert, oriented and cooperative. Patient is in no acute distress.  Skin: Skin is warm and dry. No rash noted.   Cardiovascular: Normal heart rate noted  Respiratory: Normal respiratory effort, no problems with respiration noted  Abdomen: Soft, gravid, appropriate for gestational age.  Pain/Pressure: Absent   cephalic by palpation  Pelvic: Cervical exam deferred        Extremities: Normal range of motion.  Edema: None  Mental Status: Normal mood and affect. Normal behavior. Normal judgment and thought content.   Assessment and Plan:  Pregnancy: [id number] at [redacted]w[redacted]d  1. Encounter for supervision of low-risk pregnancy in third trimester (Primary) - Culture, beta strep (group b only) - Cervicovaginal ancillary only  2. Multigravida of advanced maternal age in third trimester   3. Late prenatal care  4. Rubella non-immune status, antepartum MMR pp  5. [redacted] weeks gestation of pregnancy  6. Urinary tract infection in mother during second trimester of pregnancy Still taking meds, urine  culture next visit  Preterm labor symptoms and general obstetric precautions including but not limited to vaginal bleeding, contractions, leaking of fluid and fetal movement were reviewed in detail with the patient. Please refer to After Visit Summary for other counseling recommendations.   Return in about 1 week (around 07/01/2024) for low OB.  Future Appointments  Date Time Provider Department Center  07/01/2024  3:15 PM Izell Harari, MD [facility]  07/09/2024  8:55 AM Zina Jerilynn LABOR, MD [facility]  07/16/2024 10:55 AM Izell Harari, MD [facility]    Burnard CHRISTELLA Moats, MD

## 2024-06-25 LAB — CERVICOVAGINAL ANCILLARY ONLY
Chlamydia: NEGATIVE
Comment: NEGATIVE
Comment: NORMAL
Neisseria Gonorrhea: NEGATIVE

## 2024-06-26 LAB — URINE CULTURE: Organism ID, Bacteria: NO GROWTH

## 2024-06-28 ENCOUNTER — Ambulatory Visit: Payer: Self-pay | Admitting: Family Medicine

## 2024-06-28 DIAGNOSIS — Z3493 Encounter for supervision of normal pregnancy, unspecified, third trimester: Secondary | ICD-10-CM

## 2024-06-28 LAB — CULTURE, BETA STREP (GROUP B ONLY): Strep Gp B Culture: NEGATIVE

## 2024-07-01 ENCOUNTER — Ambulatory Visit (INDEPENDENT_AMBULATORY_CARE_PROVIDER_SITE_OTHER): Payer: MEDICAID | Admitting: Obstetrics and Gynecology

## 2024-07-01 ENCOUNTER — Encounter: Payer: Self-pay | Admitting: Obstetrics and Gynecology

## 2024-07-01 VITALS — BP 130/88 | HR 96 | Wt 162.0 lb

## 2024-07-01 DIAGNOSIS — O2340 Unspecified infection of urinary tract in pregnancy, unspecified trimester: Secondary | ICD-10-CM | POA: Insufficient documentation

## 2024-07-01 DIAGNOSIS — Z3A37 37 weeks gestation of pregnancy: Secondary | ICD-10-CM

## 2024-07-01 DIAGNOSIS — Z3493 Encounter for supervision of normal pregnancy, unspecified, third trimester: Secondary | ICD-10-CM

## 2024-07-01 DIAGNOSIS — O2343 Unspecified infection of urinary tract in pregnancy, third trimester: Secondary | ICD-10-CM | POA: Diagnosis not present

## 2024-07-01 DIAGNOSIS — O09523 Supervision of elderly multigravida, third trimester: Secondary | ICD-10-CM | POA: Diagnosis not present

## 2024-07-02 NOTE — Progress Notes (Signed)
   PRENATAL VISIT NOTE  Subjective:  Gabrielle Gibson is a 33 y.o. H4E6986 at [redacted]w[redacted]d being seen today for ongoing prenatal care.  She is currently monitored for the following issues for this low-risk pregnancy and has Supervision of low-risk pregnancy; Advanced maternal age in multigravida; Late prenatal care; Rubella non-immune status, antepartum; and UTI in pregnancy on their problem list.  Patient reports no complaints.  Contractions: Not present. Vag. Bleeding: None.  Movement: Present. Denies leaking of fluid.   The following portions of the patient's history were reviewed and updated as appropriate: allergies, current medications, past family history, past medical history, past social history, past surgical history and problem list.   Objective:    Vitals:   07/01/24 1508  BP: 130/88  Pulse: 96  Weight: 162 lb (73.5 kg)    Fetal Status:  Fetal Heart Rate (bpm): 137 Fundal Height: 37 cm Movement: Present Presentation: Vertex  General: Alert, oriented and cooperative. Patient is in no acute distress.  Skin: Skin is warm and dry. No rash noted.   Cardiovascular: Normal heart rate noted  Respiratory: Normal respiratory effort, no problems with respiration noted  Abdomen: Soft, gravid, appropriate for gestational age.  Pain/Pressure: Absent     Pelvic: Cervical exam deferred        Extremities: Normal range of motion.  Edema: None  Mental Status: Normal mood and affect. Normal behavior. Normal judgment and thought content.   Assessment and Plan:  Pregnancy: G5P3013 at [redacted]w[redacted]d 1. [redacted] weeks gestation of pregnancy (Primary) GBS neg  2. Multigravida of advanced maternal age in third trimester Since not 40 or over, hospital prohibiting 39-40wk IOL. Recommend consideration for AP testing after 40wks. Pt set up for 41wk IOL 6/18: 23%, 1877g, ac 16%, afi 12, cephalic  3. Encounter for supervision of low-risk pregnancy in third trimester  4. Urinary tract infection in mother during  pregnancy, antepartum Test of cure negative  Preterm labor symptoms and general obstetric precautions including but not limited to vaginal bleeding, contractions, leaking of fluid and fetal movement were reviewed in detail with the patient. Please refer to After Visit Summary for other counseling recommendations.   No follow-ups on file.  Future Appointments  Date Time Provider Department Center  07/09/2024  8:55 AM Zina Jerilynn LABOR, MD [facility]  07/16/2024 10:55 AM Izell Harari, MD [facility]  07/24/2024  6:45 AM [facility] VICTORINE ROOM [facility]INDC None    Harari Izell, MD

## 2024-07-05 ENCOUNTER — Other Ambulatory Visit: Payer: Self-pay

## 2024-07-05 ENCOUNTER — Encounter (HOSPITAL_COMMUNITY): Payer: Self-pay | Admitting: Obstetrics and Gynecology

## 2024-07-05 ENCOUNTER — Inpatient Hospital Stay (HOSPITAL_COMMUNITY)
Admission: AD | Admit: 2024-07-05 | Discharge: 2024-07-05 | Disposition: A | Discharge status: Home / Self Care | Payer: MEDICAID | Source: Home / Self Care | Attending: Obstetrics and Gynecology | Admitting: Obstetrics and Gynecology

## 2024-07-05 DIAGNOSIS — O479 False labor, unspecified: Secondary | ICD-10-CM | POA: Diagnosis not present

## 2024-07-05 DIAGNOSIS — O09523 Supervision of elderly multigravida, third trimester: Secondary | ICD-10-CM | POA: Insufficient documentation

## 2024-07-05 DIAGNOSIS — Z3689 Encounter for other specified antenatal screening: Secondary | ICD-10-CM

## 2024-07-05 DIAGNOSIS — O471 False labor at or after 37 completed weeks of gestation: Secondary | ICD-10-CM | POA: Insufficient documentation

## 2024-07-05 DIAGNOSIS — Z3A38 38 weeks gestation of pregnancy: Secondary | ICD-10-CM

## 2024-07-05 LAB — URINALYSIS, ROUTINE W REFLEX MICROSCOPIC
Bilirubin Urine: NEGATIVE
Glucose, UA: NEGATIVE mg/dL
Ketones, ur: 5 mg/dL — AB
Nitrite: NEGATIVE
Protein, ur: NEGATIVE mg/dL
Specific Gravity, Urine: 1.012 (ref 1.005–1.030)
pH: 6 (ref 5.0–8.0)

## 2024-07-05 NOTE — MAU Note (Signed)
 Gabrielle Gibson is a 33 y.o. at [redacted]w[redacted]d here in MAU reporting: started last night around 2100. stomach was hurting real bad she couldn't eat. Pains come and go.   Thinks it might be contractions. Was not able to sleep last night. Had to go to court this morning. Had a spot of blood this morning, none since. No water leaking. Baby is moving. Has not been checked.   Onset of complaint: last night Pain score: 10 Vitals:   07/05/24 1455  BP: 121/78  Pulse: 74  Resp: 17  Temp: 98.6 F (37 C)  SpO2: 100%     QYU:imzdd on, reports +FM Lab orders placed from triage:

## 2024-07-05 NOTE — Discharge Instructions (Signed)
 Early Labor Tips   Rest when able, especially if your labor starts at night. Side lying positions can feel better than lying on your back  Move around if you aren't able to rest or if your labor starts at night.  Gentle movement, upright or forward-leaning positions can help your baby to be in a good position and put more pressure on your cervix so it can open. Hip circles standing or on a birth ball can help.  Drink water  and eat easily digestible snacks. You may also consider an electrolyte drink (like Gatorade) if you are nauseated or do not have an appetite. Distract yourself with a movie, podcasts, walking outside, music, etc.   The Willene Harper Circuit is a series of exercises you can do in early labor to encourage your baby into a better position to make progress in labor.  It is available with pictures at themilescircuit.com  Pain management at home  Try any relaxation and breathing techniques you have learned in antenatal classes. Have a massage. Your birth partner could help by rubbing your back. Take acetaminophen  (Tylenol ) according to the instructions on the packet - it's safe to take in labour. Have a warm bath or shower. 5.  You may use a TENS unit if you have access to one.

## 2024-07-05 NOTE — MAU Note (Cosign Needed)
 Gabrielle Gibson is a 33 y.o. [phone] female at [redacted]w[redacted]d  RN Labor check, not seen by provider SVE by RN: Dilation: Fingertip Effacement (%): Thick Station: Ballotable Exam by:: K Faucett RN NST: FHR baseline 140 bpm, Variability: moderate, Accelerations:present, Decelerations:  Absent= Cat 1/Reactive Toco: irregular, every 20 minutes  D/C home  Gabrielle Gibson [name] 07/05/2024 7:42 PM

## 2024-07-05 NOTE — Progress Notes (Signed)
 Gave patient water to drink after first SVE around 1530. Returned to check patient for RN labor eval, and patient was sleeping. Discussed deferring SVE because patient does not appear to be in labor. Patient agreed that she was probably not in labor, as her contractions slowed and almost stopped with rest and hydration. Claris Cedar CNM in agreement that SVE is not necessary and patient may be discharged. Education on labor precautions provided.

## 2024-07-05 NOTE — MAU Provider Note (Signed)
 S: Ms. Gabrielle Gibson is a 33 y.o. [phone] at [redacted]w[redacted]d  who presents to MAU today for labor evaluation.     Cervical exam by RN:  Dilation: Fingertip Effacement (%): Thick Station: Ballotable Exam by:: K Faucett RN  Fetal Monitoring: Baseline: 135 Variability: moderate Accelerations: 15x15 Decelerations: absent Contractions: irregular UC  MDM Discussed patient with RN. NST reviewed.   A: SIUP at [redacted]w[redacted]d  False labor  P: Discharge home Labor precautions and kick counts included in AVS Patient to follow-up with [facility] as scheduled  Patient may return to MAU as needed or when in labor   Regino Camie LABOR, CNM 07/05/2024 6:09 PM

## 2024-07-06 ENCOUNTER — Other Ambulatory Visit: Payer: Self-pay

## 2024-07-06 ENCOUNTER — Inpatient Hospital Stay (HOSPITAL_COMMUNITY)
Admission: AD | Admit: 2024-07-06 | Discharge: 2024-07-08 | DRG: 806 | Disposition: A | Discharge status: Home / Self Care | Payer: MEDICAID | Attending: Obstetrics and Gynecology | Admitting: Obstetrics and Gynecology

## 2024-07-06 ENCOUNTER — Inpatient Hospital Stay (HOSPITAL_COMMUNITY): Payer: MEDICAID | Admitting: Anesthesiology

## 2024-07-06 ENCOUNTER — Encounter (HOSPITAL_COMMUNITY): Payer: Self-pay | Admitting: Obstetrics and Gynecology

## 2024-07-06 DIAGNOSIS — Z8249 Family history of ischemic heart disease and other diseases of the circulatory system: Secondary | ICD-10-CM | POA: Diagnosis not present

## 2024-07-06 DIAGNOSIS — O09899 Supervision of other high risk pregnancies, unspecified trimester: Secondary | ICD-10-CM

## 2024-07-06 DIAGNOSIS — O9902 Anemia complicating childbirth: Principal | ICD-10-CM | POA: Diagnosis present

## 2024-07-06 DIAGNOSIS — O09529 Supervision of elderly multigravida, unspecified trimester: Secondary | ICD-10-CM

## 2024-07-06 DIAGNOSIS — O09523 Supervision of elderly multigravida, third trimester: Secondary | ICD-10-CM | POA: Diagnosis not present

## 2024-07-06 DIAGNOSIS — N390 Urinary tract infection, site not specified: Secondary | ICD-10-CM | POA: Diagnosis present

## 2024-07-06 DIAGNOSIS — Z3A38 38 weeks gestation of pregnancy: Secondary | ICD-10-CM | POA: Diagnosis not present

## 2024-07-06 DIAGNOSIS — O480 Post-term pregnancy: Principal | ICD-10-CM | POA: Diagnosis present

## 2024-07-06 DIAGNOSIS — O0930 Supervision of pregnancy with insufficient antenatal care, unspecified trimester: Secondary | ICD-10-CM

## 2024-07-06 DIAGNOSIS — O26893 Other specified pregnancy related conditions, third trimester: Secondary | ICD-10-CM | POA: Diagnosis present

## 2024-07-06 DIAGNOSIS — O2343 Unspecified infection of urinary tract in pregnancy, third trimester: Secondary | ICD-10-CM | POA: Diagnosis present

## 2024-07-06 DIAGNOSIS — Z3493 Encounter for supervision of normal pregnancy, unspecified, third trimester: Secondary | ICD-10-CM

## 2024-07-06 DIAGNOSIS — O2340 Unspecified infection of urinary tract in pregnancy, unspecified trimester: Secondary | ICD-10-CM | POA: Diagnosis present

## 2024-07-06 DIAGNOSIS — Z2839 Other underimmunization status: Secondary | ICD-10-CM

## 2024-07-06 LAB — TYPE AND SCREEN
ABO/RH(D): A POS
Antibody Screen: NEGATIVE

## 2024-07-06 LAB — CBC
HCT: 34.7 % — ABNORMAL LOW (ref 36.0–46.0)
Hemoglobin: 10.7 g/dL — ABNORMAL LOW (ref 12.0–15.0)
MCH: 24.7 pg — ABNORMAL LOW (ref 26.0–34.0)
MCHC: 30.8 g/dL (ref 30.0–36.0)
MCV: 80.1 fL (ref 80.0–100.0)
Platelets: 227 K/uL (ref 150–400)
RBC: 4.33 MIL/uL (ref 3.87–5.11)
RDW: 15.1 % (ref 11.5–15.5)
WBC: 8.9 K/uL (ref 4.0–10.5)
nRBC: 0 % (ref 0.0–0.2)

## 2024-07-06 MED ORDER — FENTANYL-BUPIVACAINE-NACL 0.5-0.125-0.9 MG/250ML-% EP SOLN
12.0000 mL/h | EPIDURAL | Status: DC | PRN
  Administered 2024-07-06: 12 mL/h via EPIDURAL
  Filled 2024-07-06: qty 250

## 2024-07-06 MED ORDER — OXYTOCIN-SODIUM CHLORIDE 30-0.9 UT/500ML-% IV SOLN
2.5000 [IU]/h | INTRAVENOUS | Status: DC
  Filled 2024-07-06: qty 500

## 2024-07-06 MED ORDER — SOD CITRATE-CITRIC ACID 500-334 MG/5ML PO SOLN: 30.0000 mL | ORAL | Status: DC | PRN

## 2024-07-06 MED ORDER — LIDOCAINE HCL (PF) 1 % IJ SOLN: 30.0000 mL | INTRAMUSCULAR | Status: DC | PRN

## 2024-07-06 MED ORDER — PHENYLEPHRINE 80 MCG/ML (10ML) SYRINGE FOR IV PUSH (FOR BLOOD PRESSURE SUPPORT): 80.0000 ug | PREFILLED_SYRINGE | INTRAVENOUS | Status: DC | PRN

## 2024-07-06 MED ORDER — LIDOCAINE-EPINEPHRINE (PF) 2 %-1:200000 IJ SOLN
INTRAMUSCULAR | Status: DC | PRN
  Administered 2024-07-06: 3 mL via EPIDURAL

## 2024-07-06 MED ORDER — SODIUM CHLORIDE 0.9 % IV SOLN
INTRAVENOUS | Status: DC | PRN
  Administered 2024-07-06: 6 mL via EPIDURAL

## 2024-07-06 MED ORDER — EPHEDRINE 5 MG/ML INJ: 10.0000 mg | INTRAVENOUS | Status: DC | PRN

## 2024-07-06 MED ORDER — OXYTOCIN BOLUS FROM INFUSION
333.0000 mL | Freq: Once | INTRAVENOUS | Status: AC
  Administered 2024-07-06: 333 mL via INTRAVENOUS

## 2024-07-06 MED ORDER — MISOPROSTOL 25 MCG QUARTER TABLET: 25.0000 ug | ORAL_TABLET | Freq: Once | ORAL | Status: DC

## 2024-07-06 MED ORDER — DIPHENHYDRAMINE HCL 50 MG/ML IJ SOLN: 12.5000 mg | INTRAMUSCULAR | Status: DC | PRN

## 2024-07-06 MED ORDER — FENTANYL CITRATE (PF) 100 MCG/2ML IJ SOLN: 50.0000 ug | INTRAMUSCULAR | Status: DC | PRN

## 2024-07-06 MED ORDER — LACTATED RINGERS IV SOLN
500.0000 mL | Freq: Once | INTRAVENOUS | Status: AC
  Administered 2024-07-06: 500 mL via INTRAVENOUS

## 2024-07-06 MED ORDER — LACTATED RINGERS IV SOLN: INTRAVENOUS | Status: DC

## 2024-07-06 MED ORDER — ONDANSETRON HCL 4 MG/2ML IJ SOLN
4.0000 mg | Freq: Four times a day (QID) | INTRAMUSCULAR | Status: DC | PRN
  Administered 2024-07-06: 4 mg via INTRAVENOUS
  Filled 2024-07-06: qty 2

## 2024-07-06 MED ORDER — ACETAMINOPHEN 325 MG PO TABS: 650.0000 mg | ORAL_TABLET | ORAL | Status: DC | PRN

## 2024-07-06 MED ORDER — TERBUTALINE SULFATE 1 MG/ML IJ SOLN: 0.2500 mg | Freq: Once | INTRAMUSCULAR | Status: DC | PRN

## 2024-07-06 MED ORDER — LACTATED RINGERS IV SOLN: 500.0000 mL | INTRAVENOUS | Status: DC | PRN

## 2024-07-06 MED ORDER — LIDOCAINE HCL (PF) 1 % IJ SOLN
INTRAMUSCULAR | Status: DC | PRN
  Administered 2024-07-06: 3 mL via SUBCUTANEOUS

## 2024-07-06 NOTE — Progress Notes (Signed)
 Patient ID: Gabrielle Gibson, female   DOB: 1991-04-01, 33 y.o.   MRN: [id number] Vitals:   07/06/24 2001 07/06/24 2016 07/06/24 2031 07/06/24 2128  BP: 116/87  111/77 (!) 140/73  Pulse: 80  97 (!) 120  Resp:  15    Temp:  98 F (36.7 C)    TempSrc:  Oral    SpO2:       FHR reassuring  Cervix exam deferred  Anticipate progression to SVD

## 2024-07-06 NOTE — Anesthesia Preprocedure Evaluation (Signed)
 Anesthesia Evaluation  Patient identified by MRN, date of birth, ID band Patient awake    Reviewed: Allergy & Precautions, NPO status , Patient's Chart, lab work & pertinent test results  History of Anesthesia Complications Negative for: history of anesthetic complications  Airway Mallampati: II  TM Distance: >3 FB Neck ROM: Full    Dental no notable dental hx. (+) Teeth Intact   Pulmonary neg pulmonary ROS, neg sleep apnea, neg COPD, Patient abstained from smoking.Not current smoker   Pulmonary exam normal breath sounds clear to auscultation       Cardiovascular Exercise Tolerance: Good METS(-) hypertension(-) CAD and (-) Past MI negative cardio ROS (-) dysrhythmias  Rhythm:Regular Rate:Normal - Systolic murmurs    Neuro/Psych negative neurological ROS  negative psych ROS   GI/Hepatic ,neg GERD  ,,(+)     (-) substance abuse    Endo/Other  neg diabetes    Renal/GU negative Renal ROS     Musculoskeletal   Abdominal   Peds  Hematology  (+) Blood dyscrasia, anemia Denies blood thinner use or bleeding disorders.    Anesthesia Other Findings Denies blood thinner use or bleeding diatheses. Recent labs reviewed. Past Medical History: No date: Anemia No date: Medical history non-contributory   Reproductive/Obstetrics (+) Pregnancy                              Anesthesia Physical Anesthesia Plan  ASA: 2  Anesthesia Plan: Epidural   Post-op Pain Management:    Induction:   PONV Risk Score and Plan: 2 and Treatment may vary due to age or medical condition and Ondansetron   Airway Management Planned: Natural Airway  Additional Equipment:   Intra-op Plan:   Post-operative Plan:   Informed Consent: I have reviewed the patients History and Physical, chart, labs and discussed the procedure including the risks, benefits and alternatives for the proposed anesthesia with the patient or  authorized representative who has indicated his/her understanding and acceptance.       Plan Discussed with: Surgeon  Anesthesia Plan Comments: (Discussed R/B/A of neuraxial anesthesia technique with patient: - rare risks of spinal/epidural hematoma, nerve damage, infection - Risk of PDPH - Risk of itching - Risk of nausea and vomiting - Risk of poor block necessitating replacement of epidural. - Risk of allergic reactions. Patient voiced understanding.)         Anesthesia Quick Evaluation

## 2024-07-06 NOTE — MAU Note (Signed)
 Gabrielle Gibson is a 33 y.o. at [redacted]w[redacted]d here in MAU reporting: arrived via ems c/o ctxs that are every 3 minutes. States she was seen in MAU yesterday as a labor eval and was DC home and cervix dilation was a fingertip. Denies any LOF or VB. Reports +FM  States her ctxs have continued and have only increased in intensity since yesterday.  Onset of complaint: ongoing Pain score: 10 Vitals:   07/06/24 1117  BP: 137/89  Pulse: 84  Resp: 15  Temp: 97.8 F (36.6 C)  SpO2: 100%     FHT:145 Lab orders placed from triage:  Labor eval

## 2024-07-06 NOTE — H&P (Cosign Needed Addendum)
 OBSTETRIC ADMISSION HISTORY AND PHYSICAL  Gabrielle Gibson is a 33 y.o. female [phone] with IUP at [redacted]w[redacted]d by 20wk US  presenting for SOL. She reports +FMs, No LOF, no VB, no blurry vision, headaches or peripheral edema, and RUQ pain.  She plans on breast feeding. She request abstinence for birth control. She received her prenatal care at [facility]   Dating: By 20 wk US  --->  Estimated Date of Delivery: 07/17/24  Sono:    @[redacted]w[redacted]d , CWD, normal anatomy, cephalic presentation, posterior fundal placental lie, Est. FW: 1877 gm 4 lb 2 oz 23 %    Prenatal History/Complications:  AMA Late to [facility] UTI in third trimester Rubella nonimmune  Past Medical History: Past Medical History:  Diagnosis Date   Anemia    Medical history non-contributory     Past Surgical History: Past Surgical History:  Procedure Laterality Date   surgery     surgery on head due to head injury    Obstetrical History: OB History     Gravida  5   Para  3   Term  3   Preterm      AB  1   Living  3      SAB  1   IAB      Ectopic      Multiple      Live Births  3           Social History Social History   Socioeconomic History   Marital status: Single    Spouse name: Not on file   Number of children: Not on file   Years of education: Not on file   Highest education level: 12th grade  Occupational History   Not on file  Tobacco Use   Smoking status: Never   Smokeless tobacco: Never  Vaping Use   Vaping status: Never Used  Substance and Sexual Activity   Alcohol use: Never   Drug use: Never   Sexual activity: Yes    Birth control/protection: None  Other Topics Concern   Not on file  Social History Narrative   Not on file   Social Drivers of Health   Financial Resource Strain: Medium Risk (04/22/2024)   Overall Financial Resource Strain (CARDIA)    Difficulty of Paying Living Expenses: Somewhat hard  Food Insecurity: No Food Insecurity (07/05/2024)   Hunger Vital Sign    Worried  About Running Out of Food in the Last Year: Never true    Ran Out of Food in the Last Year: Never true  Recent Concern: Food Insecurity - Food Insecurity Present (04/22/2024)   Hunger Vital Sign    Worried About Running Out of Food in the Last Year: Sometimes true    Ran Out of Food in the Last Year: Sometimes true  Transportation Needs: No Transportation Needs (07/05/2024)   PRAPARE - Administrator, Civil Service (Medical): No    Lack of Transportation (Non-Medical): No  Physical Activity: Insufficiently Active (04/22/2024)   Exercise Vital Sign    Days of Exercise per Week: 2 days    Minutes of Exercise per Session: 20 min  Stress: No Stress Concern Present (04/22/2024)   [employer] of Occupational Health - Occupational Stress Questionnaire    Feeling of Stress : Only a little  Social Connections: Moderately Isolated (04/22/2024)   Social Connection and Isolation Panel    Frequency of Communication with Friends and Family: More than three times a week    Frequency of Social  Gatherings with Friends and Family: Three times a week    Attends Religious Services: More than 4 times per year    Active Member of Clubs or Organizations: No    Attends Engineer, structural: Not on file    Marital Status: Never married    Family History: Family History  Problem Relation Age of Onset   Hypertension Mother    Cervical cancer Mother    Cerebral aneurysm Mother     Allergies: No Known Allergies  Medications Prior to Admission  Medication Sig Dispense Refill Last Dose/Taking   ondansetron  (ZOFRAN ) 4 MG tablet Take 4 mg by mouth every 8 (eight) hours as needed for nausea or vomiting.   07/06/2024   Prenatal 27-1 MG TABS Take 1 tablet by mouth daily. 30 tablet 11 07/06/2024   cefadroxil  (DURICEF) 500 MG capsule Take 1 capsule (500 mg total) by mouth 2 (two) times daily. 14 capsule 0      Review of Systems   All systems reviewed and negative except as stated in  HPI  Blood pressure 132/74, pulse 82, temperature 97.8 F (36.6 C), temperature source Oral, resp. rate 15, last menstrual period 11/28/2023, SpO2 100%. General appearance: alert and cooperative Lungs: clear to auscultation bilaterally Heart: regular rate and rhythm Abdomen: soft, non-tender; bowel sounds normal Extremities: Homans sign is negative, no sign of DVT Presentation: cephalic Fetal monitoringBaseline: 130 bpm, Variability: Good {> 6 bpm), Accelerations: Reactive, and Decelerations: Absent Uterine activityFrequency: Every 4-5 minutes Dilation: 5 Effacement (%): 90 Station: -3 Exam by:: K. Cowher RN   Prenatal labs: ABO, Rh: A/Positive/-- (03/20 1243) Antibody: Negative (03/20 1243) Rubella: 0.94 (03/20 1243) RPR: Non Reactive (05/15 0853)  HBsAg: Negative (03/20 1243)  HIV: Non Reactive (05/15 0853)  GBS: Negative/-- (07/17 1532)    Lab Results  Component Value Date   GBS Negative 06/24/2024   GTT WNL Genetic screening  AFP negative Anatomy US  03/03/2024 Est. FW: 396 gm 0 lb 14 oz 72 %   Placenta:               Posterior Fundal  P. Cord Insertion:      Visualized AFI WNL Normal anatomy   There is no immunization history on file for this patient.  Prenatal Transfer Tool  Maternal Diabetes: No Genetic Screening: Normal Maternal Ultrasounds/Referrals: Normal Fetal Ultrasounds or other Referrals:  Referred to Materal Fetal Medicine for AMA Maternal Substance Abuse:  No Significant Maternal Medications:  None Significant Maternal Lab Results: None Number of Prenatal Visits:greater than 3 verified prenatal visits Maternal Vaccinations:none Other Comments:  None   Results for orders placed or performed during the hospital encounter of 07/05/24 (from the past 24 hours)  Urinalysis, Routine w reflex microscopic -Urine, Clean Catch   Collection Time: 07/05/24  4:30 PM  Result Value Ref Range   Color, Urine YELLOW YELLOW   APPearance CLOUDY (A) CLEAR    Specific Gravity, Urine 1.012 1.005 - 1.030   pH 6.0 5.0 - 8.0   Glucose, UA NEGATIVE NEGATIVE mg/dL   Hgb urine dipstick LARGE (A) NEGATIVE   Bilirubin Urine NEGATIVE NEGATIVE   Ketones, ur 5 (A) NEGATIVE mg/dL   Protein, ur NEGATIVE NEGATIVE mg/dL   Nitrite NEGATIVE NEGATIVE   Leukocytes,Ua LARGE (A) NEGATIVE   RBC / HPF 6-10 0 - 5 RBC/hpf   WBC, UA 21-50 0 - 5 WBC/hpf   Bacteria, UA FEW (A) NONE SEEN   Squamous Epithelial / HPF 11-20 0 - 5 /HPF  Mucus PRESENT     Patient Active Problem List   Diagnosis Date Noted   UTI in pregnancy 07/01/2024   Rubella non-immune status, antepartum 04/01/2024   Advanced maternal age in multigravida 03/05/2024   Late prenatal care 03/05/2024   Supervision of low-risk pregnancy 02/26/2024    Assessment/Plan:  Wenona Mayville Biernat is a 33 y.o. H4E6986 at [redacted]w[redacted]d here for SOL.  #Labor:In active labor. Will continue expectant management. #Pain: epidural #FWB: Cat 1 #GBS status:  negative #Feeding: Breastmilk  #Reproductive Life planning: Undecided #Circ:  yes  #RNI MMR postpartum  #UTI in third trimester Treated with cefadroxil . TOC 7/28 still with evidence. Asymptomatic. Will not retreat at this time.  Hobert JAYSON Kasal, MD  07/06/2024, 1:42 PM   Attestation of Supervision of Student:  I confirm that I have verified the information documented in the resident's note and that I have also personally reperformed the history, physical exam and all medical decision making activities.  I have verified that all services and findings are accurately documented in this student's note; and I agree with management and plan as outlined in the documentation. I have also made any necessary editorial changes.  Almarie CHRISTELLA Moats, MD OB Fellow 07/06/2024 5:51 PM

## 2024-07-06 NOTE — Progress Notes (Signed)
 LABOR PROGRESS NOTE  Patient Name: Gabrielle Gibson, female   DOB: 1991-01-09, 33 y.o.  MRN: [id number]  FOB arrived to hospital. Discussed check and AROM, which patient agreed. AROM to clear fluid.  120s/moderate/+accels/-decels Toco: q3-69min  Good cervical change. Will continue expectant management.  Hobert JAYSON Kasal, MD

## 2024-07-06 NOTE — Anesthesia Procedure Notes (Signed)
 Epidural Patient location during procedure: OB Start time: 07/06/2024 2:40 PM End time: 07/06/2024 2:56 PM  Staffing Anesthesiologist: Boone Fess, MD Performed: anesthesiologist   Preanesthetic Checklist Completed: patient identified, IV checked, site marked, risks and benefits discussed, surgical consent, monitors and equipment checked, pre-op evaluation and timeout performed  Epidural Patient position: sitting Prep: ChloraPrep Patient monitoring: heart rate, continuous pulse ox and blood pressure Approach: midline Location: L3-L4 Injection technique: LOR saline  Needle:  Needle type: Tuohy  Needle gauge: 17 G Needle length: 9 cm Needle insertion depth: 5 cm Catheter type: closed end flexible Catheter size: 19 Gauge Catheter at skin depth: 10 cm Test dose: negative and 1.5% lidocaine  with Epi 1:200 K  Assessment Sensory level: T10 Events: blood not aspirated, no cerebrospinal fluid, injection not painful, no injection resistance, no paresthesia and negative IV test  Additional Notes First/one attempt Pt. Evaluated and documentation done after procedure finished. Patient identified. Risks/Benefits/Options discussed with patient including but not limited to bleeding, infection, nerve damage, paralysis, failed block, incomplete pain control, headache, blood pressure changes, nausea, vomiting, reactions to medication both or allergic, itching and postpartum back pain. Confirmed with bedside nurse the patient's most recent platelet count. Confirmed with patient that they are not currently taking any anticoagulation, have any bleeding history or any family history of bleeding disorders. Patient expressed understanding and wished to proceed. All questions were answered. Sterile technique was used throughout the entire procedure. Please see nursing notes for vital signs. Test dose was given through epidural catheter and negative prior to continuing to dose epidural or start infusion.  Warning signs of high block given to the patient including shortness of breath, tingling/numbness in hands, complete motor block, or any concerning symptoms with instructions to call for help. Patient was given instructions on fall risk and not to get out of bed. All questions and concerns addressed with instructions to call with any issues or inadequate analgesia.     Patient tolerated the insertion well without immediate complications.  Reason for block: procedure for painReason for block:procedure for pain

## 2024-07-07 ENCOUNTER — Encounter (HOSPITAL_COMMUNITY): Payer: Self-pay | Admitting: Obstetrics and Gynecology

## 2024-07-07 LAB — RPR: RPR Ser Ql: NONREACTIVE

## 2024-07-07 MED ORDER — BENZOCAINE-MENTHOL 20-0.5 % EX AERO
1.0000 | INHALATION_SPRAY | CUTANEOUS | Status: DC | PRN
  Administered 2024-07-07: 1 via TOPICAL
  Filled 2024-07-07: qty 56

## 2024-07-07 MED ORDER — SENNOSIDES-DOCUSATE SODIUM 8.6-50 MG PO TABS
2.0000 | ORAL_TABLET | ORAL | Status: DC
  Administered 2024-07-07: 2 via ORAL
  Filled 2024-07-07 (×2): qty 2

## 2024-07-07 MED ORDER — COCONUT OIL OIL: 1.0000 | TOPICAL_OIL | Status: DC | PRN

## 2024-07-07 MED ORDER — DIPHENHYDRAMINE HCL 25 MG PO CAPS: 25.0000 mg | ORAL_CAPSULE | Freq: Four times a day (QID) | ORAL | Status: DC | PRN

## 2024-07-07 MED ORDER — ONDANSETRON HCL 4 MG PO TABS: 4.0000 mg | ORAL_TABLET | ORAL | Status: DC | PRN

## 2024-07-07 MED ORDER — IBUPROFEN 600 MG PO TABS
600.0000 mg | ORAL_TABLET | Freq: Four times a day (QID) | ORAL | Status: DC
  Administered 2024-07-07 – 2024-07-08 (×6): 600 mg via ORAL
  Filled 2024-07-07 (×7): qty 1

## 2024-07-07 MED ORDER — TETANUS-DIPHTH-ACELL PERTUSSIS 5-2.5-18.5 LF-MCG/0.5 IM SUSY: 0.5000 mL | PREFILLED_SYRINGE | Freq: Once | INTRAMUSCULAR | Status: DC

## 2024-07-07 MED ORDER — ACETAMINOPHEN 325 MG PO TABS
650.0000 mg | ORAL_TABLET | ORAL | Status: DC | PRN
  Administered 2024-07-07 (×2): 650 mg via ORAL
  Filled 2024-07-07 (×2): qty 2

## 2024-07-07 MED ORDER — PRENATAL MULTIVITAMIN CH
1.0000 | ORAL_TABLET | Freq: Every day | ORAL | Status: DC
  Administered 2024-07-07: 1 via ORAL
  Filled 2024-07-07: qty 1

## 2024-07-07 MED ORDER — ONDANSETRON HCL 4 MG/2ML IJ SOLN: 4.0000 mg | INTRAMUSCULAR | Status: DC | PRN

## 2024-07-07 MED ORDER — WITCH HAZEL-GLYCERIN EX PADS: 1.0000 | MEDICATED_PAD | CUTANEOUS | Status: DC | PRN

## 2024-07-07 MED ORDER — DIBUCAINE (PERIANAL) 1 % EX OINT: 1.0000 | TOPICAL_OINTMENT | CUTANEOUS | Status: DC | PRN

## 2024-07-07 MED ORDER — ZOLPIDEM TARTRATE 5 MG PO TABS: 5.0000 mg | ORAL_TABLET | Freq: Every evening | ORAL | Status: DC | PRN

## 2024-07-07 MED ORDER — SIMETHICONE 80 MG PO CHEW: 80.0000 mg | CHEWABLE_TABLET | ORAL | Status: DC | PRN

## 2024-07-07 NOTE — Anesthesia Postprocedure Evaluation (Signed)
 Anesthesia Post Note  Patient: Gabrielle Gibson  Procedure(s) Performed: AN AD HOC LABOR EPIDURAL     Patient location during evaluation: Mother Baby Anesthesia Type: Epidural Level of consciousness: awake and alert Pain management: pain level controlled Vital Signs Assessment: post-procedure vital signs reviewed and stable Respiratory status: spontaneous breathing, nonlabored ventilation and respiratory function stable Cardiovascular status: stable Postop Assessment: no headache, no backache and epidural receding Anesthetic complications: no   No notable events documented.  Last Vitals:  Vitals:   07/07/24 0232 07/07/24 0624  BP: 118/71 109/69  Pulse: 91 (!) 104  Resp: 18 18  Temp: 36.7 C 37 C  SpO2: 100% 100%    Last Pain:  Vitals:   07/07/24 0849  TempSrc:   PainSc: 9    Pain Goal:                   [employer]

## 2024-07-07 NOTE — Lactation Note (Signed)
 This note was copied from a baby's chart. Lactation Consultation Note  Patient Name: Gabrielle Gibson Unijb'd Date: 07/07/2024 Age:35 hours Reason for consult: Initial assessment;Early term 42-38.6wks  P4, Mother is experienced with breastfeeding. Mother latched baby in cradle hold.  LC turned baby toward mother and also demonstrated how to latch in football hold. Provided mother with manual pump.  Drops expressed which can be give to baby on spoon. Feed on demand with cues.  Goal 8-12+ times per day after first 24 hrs.  Suggest calling for help as needed today.  Maternal Data Has patient been taught Hand Expression?: Yes Does the patient have breastfeeding experience prior to this delivery?: Yes How long did the patient breastfeed?: 8 months  Feeding Mother's Current Feeding Choice: Breast Milk  LATCH Score Latch: Grasps breast easily, tongue down, lips flanged, rhythmical sucking.  Audible Swallowing: A few with stimulation  Type of Nipple: Everted at rest and after stimulation  Comfort (Breast/Nipple): Soft / non-tender  Hold (Positioning): Assistance needed to correctly position infant at breast and maintain latch.  LATCH Score: 8  Lactation Tools Discussed/Used Tools: Pump;Flanges Flange Size: 24 Breast pump type: Manual Pump Education: Setup, frequency, and cleaning;Milk Storage Reason for Pumping: PRN Pumping frequency: PRN  Interventions Interventions: Breast feeding basics reviewed;Assisted with latch;Hand pump;Education;CDC milk storage guidelines;LC Services brochure  Discharge Pump: Manual (Advised to call WIC to set up Appt)  Consult Status Consult Status: Follow-up Date: 07/08/24 Follow-up type: In-patient   Shannon Dines Boschen  RN, IBCLC 07/07/2024, 8:16 AM

## 2024-07-07 NOTE — Discharge Summary (Signed)
     Postpartum Discharge Summary  Date of Service updated***     Patient Name: Gabrielle Gibson DOB: 01/12/1991 MRN: [id number]  Date of admission: 07/06/2024 Delivery date:07/06/2024 Delivering provider: TRUDY EARNIE CROME Date of discharge: 07/07/2024  Admitting diagnosis: Post-dates pregnancy [O48.0] Intrauterine pregnancy: [redacted]w[redacted]d     Secondary diagnosis:  Principal Problem:   Post-dates pregnancy Active Problems:   Advanced maternal age in multigravida   Late prenatal care   Rubella non-immune status, antepartum   UTI in pregnancy  Additional problems: ***    Discharge diagnosis: {DX.:23714}                                              Post partum procedures:{Postpartum procedures:23558} Augmentation: {Augmentation:20782} Complications: {OB Labor/Delivery Complications:20784}  Hospital course: {Courses:23701}  Magnesium Sulfate received: {Mag received:30440022} BMZ received: {BMZ received:30440023} Rhophylac:{Rhophylac received:30440032} FFM:{FFM:69559966} T-DaP:{Tdap:23962} Flu: {Qol:76036} RSV Vaccine received: {RSV:31013} Transfusion:{Transfusion received:30440034} Immunizations administered:  There is no immunization history on file for this patient.  Physical exam  Vitals:   07/06/24 2331 07/06/24 2347 07/07/24 0005 07/07/24 0016  BP: 121/65 116/89 135/88 133/87  Pulse: (!) 122 (!) 128 (!) 113 (!) 104  Resp:      Temp:      TempSrc:      SpO2:       General: {Exam; general:21111117} Lochia: {Desc; appropriate/inappropriate:30686::appropriate} Uterine Fundus: {Desc; firm/soft:30687} Incision: {Exam; incision:21111123} DVT Evaluation: {Exam; dvt:2111122} Labs: Lab Results  Component Value Date   WBC 8.9 07/06/2024   HGB 10.7 (L) 07/06/2024   HCT 34.7 (L) 07/06/2024   MCV 80.1 07/06/2024   PLT 227 07/06/2024      Latest Ref Rng & Units 05/26/2024    2:28 PM  CMP  Glucose 70 - 99 mg/dL 82   BUN 6 - 20 mg/dL <5   Creatinine 9.55 - 1.00 mg/dL  9.32   Sodium 864 - 854 mmol/L 139   Potassium 3.5 - 5.1 mmol/L 3.7   Chloride 98 - 111 mmol/L 108   CO2 22 - 32 mmol/L 20   Calcium 8.9 - 10.3 mg/dL 8.9   Total Protein 6.5 - 8.1 g/dL 6.6   Total Bilirubin 0.0 - 1.2 mg/dL 0.9   Alkaline Phos 38 - 126 U/L 94   AST 15 - 41 U/L 21   ALT 0 - 44 U/L 14    Edinburgh Score:     No data to display            After visit meds:  Allergies as of 07/07/2024   No Known Allergies   Med Rec must be completed prior to using this [facility]***        Discharge home in stable condition Infant Feeding: {Baby feeding:23562} Infant Disposition:{CHL IP OB HOME WITH FNUYZM:76418} Discharge instruction: per After Visit Summary and Postpartum booklet. Activity: Advance as tolerated. Pelvic rest for 6 weeks.  Diet: {OB diet:21111121} Anticipated Birth Control: {Birth Control:23956} Postpartum Appointment:{Outpatient follow up:23559} Additional Postpartum F/U: {PP Procedure:23957} Future Appointments: Future Appointments  Date Time Provider Department Center  07/09/2024  8:55 AM Zina Jerilynn LABOR, MD [facility]  07/16/2024 10:55 AM Izell Harari, MD [facility]   Follow up Visit:      07/07/2024 EARNIE TRUDY, CNM

## 2024-07-07 NOTE — Progress Notes (Signed)
 POSTPARTUM PROGRESS NOTE  Post Partum Day 1  Subjective:  Gabrielle Gibson is a 33 y.o. H4E5985 s/p SVD at [redacted]w[redacted]d.  No acute events overnight.  Pt denies problems with ambulating, voiding or po intake.  She denies nausea or vomiting.  Pain is well controlled.  She has had flatus. She has had bowel movement, denies hard stool or straining.  Lochia Small. Considering Nexplanon  for contraception, would like to wait for postpartum office visit for placement/to discuss other options.  Objective: Blood pressure 107/63, pulse 99, temperature 98.1 F (36.7 C), temperature source Oral, resp. rate 20, last menstrual period 11/28/2023, SpO2 100%, unknown if currently breastfeeding.  Physical Exam:  General: alert, cooperative and no distress Chest: no respiratory distress Heart:regular rate, distal pulses intact Abdomen: soft, nontender Uterine Fundus: firm, appropriately tender DVT Evaluation: No calf swelling or tenderness Extremities: no edema Skin: warm, dry  Recent Labs    07/06/24 1336  HGB 10.7*  HCT 34.7*    Assessment/Plan: Gabrielle Gibson is a 33 y.o. H4E5985 s/p SVD at [redacted]w[redacted]d   PPD#1 - Doing well Contraception: considering Nexplanon  vs other options, will choose at [facility] visit Circumcision: discussed risks vs benefits, patient agreeable to circumcision, consent in baby's chart Feeding: breast, going well Dispo: Plan for discharge PPD#2.   LOS: 1 day   Wallace Joesph LABOR, [place], CNM 07/07/2024, 10:45 AM

## 2024-07-08 ENCOUNTER — Other Ambulatory Visit (HOSPITAL_COMMUNITY): Payer: Self-pay

## 2024-07-08 MED ORDER — COCONUT OIL OIL: 1.0000 | TOPICAL_OIL | Status: AC | PRN

## 2024-07-08 MED ORDER — LIDOCAINE HCL 1 % IJ SOLN
0.0000 mL | Freq: Once | INTRAMUSCULAR | Status: DC | PRN
  Filled 2024-07-08: qty 20

## 2024-07-08 MED ORDER — IBUPROFEN 600 MG PO TABS: 600.0000 mg | ORAL_TABLET | Freq: Four times a day (QID) | ORAL | 0 refills | Status: DC

## 2024-07-08 MED ORDER — ACETAMINOPHEN 325 MG PO TABS: 650.0000 mg | ORAL_TABLET | ORAL | Status: DC | PRN

## 2024-07-08 MED ORDER — ACETAMINOPHEN 325 MG PO TABS: 650.0000 mg | ORAL_TABLET | ORAL | Status: AC | PRN

## 2024-07-08 MED ORDER — ACETAMINOPHEN 500 MG PO TABS: 1000.0000 mg | ORAL_TABLET | Freq: Four times a day (QID) | ORAL | Status: DC | PRN

## 2024-07-08 MED ORDER — ACETAMINOPHEN 500 MG PO TABS
1000.0000 mg | ORAL_TABLET | Freq: Four times a day (QID) | ORAL | 0 refills | Status: AC | PRN
  Filled 2024-07-08: qty 30, 4d supply, fill #0

## 2024-07-08 MED ORDER — BENZOCAINE-MENTHOL 20-0.5 % EX AERO: 1.0000 | INHALATION_SPRAY | CUTANEOUS | Status: AC | PRN

## 2024-07-08 MED ORDER — WITCH HAZEL-GLYCERIN EX PADS: 1.0000 | MEDICATED_PAD | CUTANEOUS | Status: AC | PRN

## 2024-07-08 MED ORDER — IBUPROFEN 600 MG PO TABS
600.0000 mg | ORAL_TABLET | Freq: Four times a day (QID) | ORAL | 0 refills | Status: AC
  Filled 2024-07-08: qty 30, 8d supply, fill #0

## 2024-07-08 MED ORDER — ETONOGESTREL 68 MG ~~LOC~~ IMPL
68.0000 mg | DRUG_IMPLANT | Freq: Once | SUBCUTANEOUS | Status: DC
  Filled 2024-07-08: qty 1

## 2024-07-08 MED ORDER — OXYCODONE HCL 5 MG PO TABS
5.0000 mg | ORAL_TABLET | Freq: Once | ORAL | Status: DC
  Filled 2024-07-08: qty 1

## 2024-07-08 NOTE — Lactation Note (Signed)
 This note was copied from a baby's chart. Lactation Consultation Note  Patient Name: Gabrielle Gibson Unijb'd Date: 07/08/2024 Age:35 hours Reason for consult: Follow-up assessment;Early term 37-38.6wks  P4, Discussed offering both breasts per feeding and frequency. Reviewed engorgement care and monitoring voids/stools. Mother does not have questions at this time.  Stools transitioning.   Maternal Data Has patient been taught Hand Expression?: Yes  Feeding Mother's Current Feeding Choice: Breast Milk  Interventions Interventions: Breast feeding basics reviewed;Education  Discharge Discharge Education: Engorgement and breast care;Warning signs for feeding baby  Consult Status Consult Status: Complete Date: 07/08/24   Shannon Levorn Lemme  RN, IBCLC 07/08/2024, 10:02 AM

## 2024-07-08 NOTE — Patient Instructions (Signed)
 If interested in an outpatient lactation consult in office or virtually please reach out to us  at [facility] (First Floor) [address], [place]  Please call [phone] and press [phone] for lactation.    Lactation support groups:  [facility], Tuesdays 10:00 am -12:00 pm at [address] on the second floor in the conference room, lactating parents and lap babies welcome.  [email or web address]  [email or web address]   Geraldina Louder, [facility] for [facility]

## 2024-07-09 ENCOUNTER — Encounter: Payer: MEDICAID | Admitting: Obstetrics and Gynecology

## 2024-07-16 ENCOUNTER — Telehealth: Payer: Self-pay

## 2024-07-16 ENCOUNTER — Encounter: Payer: MEDICAID | Admitting: Obstetrics and Gynecology

## 2024-07-16 NOTE — Telephone Encounter (Signed)
 [facility] contacts unit, via walkie and request number to call unit.  Gabrielle Gibson calls [facility] stating they are on location with patient and SO who claim they have recently delivered female infant. He states patient claiming to have murdered infant, but they are unable to locate body of fetus.  Requests information to confirm recent delivery and sex of infant delivered. Further requests information for [employer] contact to confirm they are not in possession of infant.  Provider able to confirm information and provide phone number for [name]-patient sister.    [name] MSN, CNM Advanced Practice Provider, [facility] [employer]

## 2024-07-22 ENCOUNTER — Telehealth (HOSPITAL_COMMUNITY): Payer: Self-pay | Admitting: *Deleted

## 2024-07-22 NOTE — Telephone Encounter (Signed)
 07/22/2024  Name: JEMIAH CUADRA MRN: [id number] DOB: Oct 11, 1991  Reason for Call:  Transition of Care Hospital Discharge Call  Contact Status: Patient Contact Status: Complete  Language assistant needed:          Follow-Up Questions: Do You Have Any Concerns About Your Health As You Heal From Delivery?: No Per chart review - infant in CPS custody. Edinburgh Postnatal Depression Scale:  In the Past 7 Days:   EPDS not completed at this time, per patient request. Patient requested call back tomorrow. PHQ2-9 Depression Scale:     Discharge Follow-up: Edinburgh score requires follow up?: N/A  Post-discharge interventions: NA  Signature Allean IVAR Carton, RN, 07/22/24, [phone]

## 2024-07-24 ENCOUNTER — Inpatient Hospital Stay (HOSPITAL_COMMUNITY): Payer: MEDICAID

## 2024-07-24 ENCOUNTER — Telehealth (HOSPITAL_COMMUNITY): Payer: Self-pay | Admitting: *Deleted

## 2024-07-24 ENCOUNTER — Inpatient Hospital Stay (HOSPITAL_COMMUNITY): Admission: AD | Admit: 2024-07-24 | Payer: MEDICAID | Source: Home / Self Care | Admitting: Family Medicine

## 2024-07-24 NOTE — Telephone Encounter (Signed)
 Attempted return call to complete EPDS. Left message for patient to return RN call. Allean IVAR Carton, RN, 07/24/24, [phone]

## 2024-08-23 ENCOUNTER — Ambulatory Visit: Payer: MEDICAID | Admitting: Family Medicine

## 2024-08-24 ENCOUNTER — Ambulatory Visit: Payer: MEDICAID | Admitting: Certified Nurse Midwife

## 2024-08-24 NOTE — Progress Notes (Signed)
 Patient did not come to appointment. May reschedule.  Camie Rote, MSN, CNM, RNC-OB Certified Nurse Midwife, [facility] 08/24/2024 3:42 PM
